# Patient Record
Sex: Female | Born: 1960 | Race: White | Hispanic: No | Smoking: Current some day smoker
Health system: Southern US, Community
[De-identification: ages and names within clinical notes are randomized; demographics above are authoritative.]

## PROBLEM LIST (undated history)

## (undated) DIAGNOSIS — M542 Cervicalgia: Secondary | ICD-10-CM

## (undated) DIAGNOSIS — N63 Unspecified lump in unspecified breast: Secondary | ICD-10-CM

## (undated) DIAGNOSIS — K259 Gastric ulcer, unspecified as acute or chronic, without hemorrhage or perforation: Secondary | ICD-10-CM

## (undated) DIAGNOSIS — M549 Dorsalgia, unspecified: Secondary | ICD-10-CM

## (undated) DIAGNOSIS — G8929 Other chronic pain: Secondary | ICD-10-CM

## (undated) DIAGNOSIS — K219 Gastro-esophageal reflux disease without esophagitis: Secondary | ICD-10-CM

## (undated) DIAGNOSIS — E079 Disorder of thyroid, unspecified: Secondary | ICD-10-CM

## (undated) DIAGNOSIS — K254 Chronic or unspecified gastric ulcer with hemorrhage: Secondary | ICD-10-CM

## (undated) DIAGNOSIS — I10 Essential (primary) hypertension: Secondary | ICD-10-CM

## (undated) DIAGNOSIS — B029 Zoster without complications: Secondary | ICD-10-CM

## (undated) DIAGNOSIS — T7840XA Allergy, unspecified, initial encounter: Secondary | ICD-10-CM

## (undated) DIAGNOSIS — E785 Hyperlipidemia, unspecified: Secondary | ICD-10-CM

## (undated) DIAGNOSIS — M4310 Spondylolisthesis, site unspecified: Secondary | ICD-10-CM

## (undated) HISTORY — DX: Allergy, unspecified, initial encounter: T78.40XA

## (undated) HISTORY — DX: Hyperlipidemia, unspecified: E78.5

## (undated) HISTORY — DX: Gastro-esophageal reflux disease without esophagitis: K21.9

## (undated) HISTORY — DX: Chronic or unspecified gastric ulcer with hemorrhage: K25.4

## (undated) HISTORY — DX: Gastric ulcer, unspecified as acute or chronic, without hemorrhage or perforation: K25.9

---

## 1990-05-25 HISTORY — PX: CHOLECYSTECTOMY: SHX55

## 1998-06-05 ENCOUNTER — Other Ambulatory Visit: Admission: RE | Admit: 1998-06-05 | Discharge: 1998-06-05 | Payer: Self-pay | Admitting: Gynecology

## 1999-06-30 ENCOUNTER — Other Ambulatory Visit: Admission: RE | Admit: 1999-06-30 | Discharge: 1999-06-30 | Payer: Self-pay | Admitting: Gynecology

## 2000-08-04 ENCOUNTER — Other Ambulatory Visit: Admission: RE | Admit: 2000-08-04 | Discharge: 2000-08-04 | Payer: Self-pay | Admitting: Gynecology

## 2001-09-05 ENCOUNTER — Other Ambulatory Visit: Admission: RE | Admit: 2001-09-05 | Discharge: 2001-09-05 | Payer: Self-pay | Admitting: Gynecology

## 2002-01-03 ENCOUNTER — Encounter: Admission: RE | Admit: 2002-01-03 | Discharge: 2002-01-03 | Payer: Self-pay | Admitting: Gynecology

## 2002-01-03 ENCOUNTER — Encounter: Payer: Self-pay | Admitting: Gynecology

## 2002-10-02 ENCOUNTER — Other Ambulatory Visit: Admission: RE | Admit: 2002-10-02 | Discharge: 2002-10-02 | Payer: Self-pay | Admitting: Gynecology

## 2003-01-15 ENCOUNTER — Encounter: Payer: Self-pay | Admitting: Gynecology

## 2003-01-15 ENCOUNTER — Encounter: Admission: RE | Admit: 2003-01-15 | Discharge: 2003-01-15 | Payer: Self-pay | Admitting: Gynecology

## 2003-05-26 HISTORY — PX: ABDOMINAL HYSTERECTOMY: SHX81

## 2003-10-07 ENCOUNTER — Emergency Department (HOSPITAL_COMMUNITY): Admission: EM | Admit: 2003-10-07 | Discharge: 2003-10-07 | Payer: Self-pay | Admitting: Emergency Medicine

## 2003-12-03 ENCOUNTER — Other Ambulatory Visit: Admission: RE | Admit: 2003-12-03 | Discharge: 2003-12-03 | Payer: Self-pay | Admitting: Gynecology

## 2004-01-03 ENCOUNTER — Encounter (INDEPENDENT_AMBULATORY_CARE_PROVIDER_SITE_OTHER): Payer: Self-pay | Admitting: *Deleted

## 2004-01-03 ENCOUNTER — Observation Stay (HOSPITAL_COMMUNITY): Admission: RE | Admit: 2004-01-03 | Discharge: 2004-01-04 | Payer: Self-pay | Admitting: Gynecology

## 2004-04-07 ENCOUNTER — Ambulatory Visit (HOSPITAL_COMMUNITY): Admission: RE | Admit: 2004-04-07 | Discharge: 2004-04-07 | Payer: Self-pay | Admitting: Gynecology

## 2005-05-12 ENCOUNTER — Ambulatory Visit (HOSPITAL_COMMUNITY): Admission: RE | Admit: 2005-05-12 | Discharge: 2005-05-12 | Payer: Self-pay | Admitting: Gynecology

## 2006-05-13 ENCOUNTER — Ambulatory Visit (HOSPITAL_COMMUNITY): Admission: RE | Admit: 2006-05-13 | Discharge: 2006-05-13 | Payer: Self-pay | Admitting: Gynecology

## 2007-05-25 ENCOUNTER — Ambulatory Visit (HOSPITAL_COMMUNITY): Admission: RE | Admit: 2007-05-25 | Discharge: 2007-05-25 | Payer: Self-pay | Admitting: Gynecology

## 2008-06-06 ENCOUNTER — Ambulatory Visit (HOSPITAL_COMMUNITY): Admission: RE | Admit: 2008-06-06 | Discharge: 2008-06-06 | Payer: Self-pay | Admitting: Gynecology

## 2009-03-07 ENCOUNTER — Encounter: Admission: RE | Admit: 2009-03-07 | Discharge: 2009-03-07 | Payer: Self-pay | Admitting: Internal Medicine

## 2009-05-30 ENCOUNTER — Ambulatory Visit (HOSPITAL_COMMUNITY): Admission: RE | Admit: 2009-05-30 | Discharge: 2009-05-30 | Payer: Self-pay | Admitting: Gynecology

## 2010-06-15 ENCOUNTER — Encounter: Payer: Self-pay | Admitting: Gynecology

## 2010-10-10 NOTE — Op Note (Signed)
NAME:  Kristin Mann, Kristin Mann                     ACCOUNT NO.:  [id number]   MEDICAL RECORD NO.:  [id number]                   PATIENT TYPE:  OBV   LOCATION:  0464                                 FACILITY:  [facility]   PHYSICIAN:  Gretta Cool, M.D.              DATE OF BIRTH:  07-14-1960   DATE OF PROCEDURE:  DATE OF DISCHARGE:  01/04/2004                                 OPERATIVE REPORT   PREOPERATIVE DIAGNOSES:  1. Abnormal uterine bleeding with endometrial polyp versus fibroid.  2. History of cesarean section x3.   POSTOPERATIVE DIAGNOSIS:  Endometrial polyp with abnormal uterine bleeding.   PROCEDURE:  Supracervical hysterectomy.   SURGEON:  Gretta Cool, M.D.   ASSISTANT:  Raynald Kemp, M.D.   ANESTHESIA:  General orotracheal.   DESCRIPTION OF PROCEDURE:  Under excellent general anesthesia with the  patient prepped and draped in supine position with Foley catheter draining  the bladder a Pfannenstiel incision was made and extended through the  fascia, peritoneum was then opened and the abdomen explored, there were no  abnormalities identified in the upper abdomen.  Examination into the pelvis  revealed normal ovaries, a broad soft uterus with no evidence of  endometriosis or other pelvic pathology.  The round ligaments were then  sutured and tied and transected, the anterior length of the broad ligament  was opened and the bladder flap pushed off the lower segment.  The ovarian  ligaments were then isolated, clamped, cut, sutured, and tied with 0 Vicryl.  The uterine vessel was then clamped, cut, sutured, and tied with 0 Vicryl.  The cervix was then excised and the center of the cervix treated with  cautery so as to eliminate any viable endocervical/endometrial tissue in the  canal of the cervix.  At this point the cervix was closed with a running  mattress suture of 0 Vicryl.  At this point the pelvic floor was  reperitonealized with a running suture of 2-0 Monocryl.   The packs and  retractors were then removed, pelvis irrigated, the abdominal peritoneum was  then closed with a running suture of 0 Monocryl.  The fascia was then  approximated with a running suture of 0 Vicryl from each angle of the  midline, subcutaneous tissues approximated with interrupted sutures with 3-0  Vicryl, the skin was closed with skin staples and Steri-Strips.  At the end  of the procedure sponge and lap counts were correct, there were no  complications and patient returned to recovery room in excellent condition.                                               Gretta Cool, M.D.    CWL/MEDQ  D:  01/04/2004  T:  01/04/2004  Job:  [id number]   cc:   [facility]

## 2010-10-10 NOTE — H&P (Signed)
NAME:  Kristin Mann, Kristin Mann                         ACCOUNT NO.:  [id number]   MEDICAL RECORD NO.:  [id number]                   PATIENT TYPE:  AMB   LOCATION:  DAY                                  FACILITY:  [facility]   PHYSICIAN:  Gretta Cool, M.D.              DATE OF BIRTH:  1960-10-07   DATE OF ADMISSION:  01/03/2004  DATE OF DISCHARGE:                                HISTORY & PHYSICAL   CHIEF COMPLAINT:  Abnormal uterine bleeding.   HISTORY OF PRESENT ILLNESS:  A 50 year old gravida 4, para 3, AB 1, with a  history of cesarean section deliveries and tubal sterilization procedure.  She has an uneventful obstetric history other than cesarean section.  She  has over the last year developed increasing abnormal uterine bleeding, now  very heavy flow with clots and severe cramps.  She is confined to the home a  day or two of her cycle because of the extremely heavy bleeding.  Ultrasound  examination reveals a thickened endometrium measuring 1.4 cm.  She has a  very heterogenous character of endometrial lining highly suggestive of  endometrial polyp.  She also has known uterine leiomyomata encroaching the  anterior uterine wall.  She has been offered options, including hysteroscopy  and conservative management.  She at this point wishes to proceed to  definitive therapy by supracervical versus total abdominal hysterectomy,  possible salpingo-oophorectomy.   PAST MEDICAL HISTORY:  The usual childhood diseases without sequelae.   Medical illnesses:  Recent onset of hypertension sufficient to require  therapy, now on Benicar.  She has long-term hypothyroid, on replacement,  autoimmune hypothyroid.  She is also on Prilosec for GERD.   FAMILY HISTORY:  Mother recently died of diabetes at early age.  She also  has a history of hypothyroidism, stroke.  Father also had stroke.  The  patient also has severe dyslipidemia, particularly with marked elevation of  triglycerides, with a hemoglobin  A1C elevated, suspicious of at least  impaired glucose tolerance.  No other known familial tendencies.   HABITS:  Denies tobacco but consumes as much as one bottle of wine daily.  Denies recreational drugs.   SOCIAL HISTORY:  Divorced.  Mother of three with three children living at  home.   REVIEW OF SYSTEMS:  HEENT:  Denies symptoms.  CARDIAC:  Denies asthma,  cough, bronchitis, shortness of breath.  GASTROINTESTINAL:  Denies frequency  or urgency or change in bowel habits __________.   PHYSICAL EXAMINATION:  GENERAL:  A fairly well-developed, well-nourished  white female, rather massively over ideal weight with BMI greater than 40,  with massive abdomen to hip ratio.  HEENT:  Pupils equal and react to light and accommodation.  Fundi not  examined.  Oropharynx clear.  NECK:  Supple without mass or thyroid enlargement.  No node enlargement.  BREASTS:  Without mass, nodes, nipple discharge.  CHEST:  Clear P to A.  CARDIAC:  Regular rhythm without murmur or cardiac enlargement.  ABDOMEN:  Massive panniculus without mass or organomegaly.  PELVIC:  External genitalia normal female.  Vagina clean and rugous.  Cervix  is nulliparous in appearance, clean, and uterus normal size, shape, contour.  Adnexa clear.  Rectovaginal confirms.  Note pelvic is limited by abdominal  girth.   IMPRESSION:  1. Abnormal uterine bleeding with submucous fibroids bulging against the     cavity and endometrial polyp.  2. Autoimmune hypothyroid.  3. Hypertension.  4. Dyslipidemia with hypertriglyceridemia.  5. Impaired glucose tolerance.   PLAN:  Supracervical versus total abdominal hysterectomy, possible salpingo-  oophorectomy.                                               Gretta Cool, M.D.    CWL/MEDQ  D:  01/02/2004  T:  01/03/2004  Job:  [id number]   cc:   [facility]

## 2011-08-11 ENCOUNTER — Other Ambulatory Visit (HOSPITAL_COMMUNITY): Payer: Self-pay | Admitting: Gynecology

## 2011-08-11 DIAGNOSIS — Z1231 Encounter for screening mammogram for malignant neoplasm of breast: Secondary | ICD-10-CM

## 2011-08-24 ENCOUNTER — Ambulatory Visit (HOSPITAL_COMMUNITY)
Admission: RE | Admit: 2011-08-24 | Discharge: 2011-08-24 | Disposition: A | Discharge status: Home / Self Care | Payer: Self-pay | Source: Ambulatory Visit | Attending: Gynecology | Admitting: Gynecology

## 2011-08-24 DIAGNOSIS — Z1231 Encounter for screening mammogram for malignant neoplasm of breast: Secondary | ICD-10-CM

## 2011-08-26 ENCOUNTER — Telehealth: Payer: Self-pay

## 2011-08-26 NOTE — Telephone Encounter (Signed)
.  [facility] The patient called requesting cd of xrays on spine/shoulder from 01/23/2010 ([facility]). Patient would like to be called at [phone] when ready for pick-up.

## 2011-08-27 NOTE — Telephone Encounter (Signed)
Note was left for Xray req copy to be made.

## 2011-08-28 NOTE — Telephone Encounter (Signed)
Notified pt that xray copy is ready for p/up. Pt was very appreciative.

## 2011-09-14 ENCOUNTER — Encounter (HOSPITAL_BASED_OUTPATIENT_CLINIC_OR_DEPARTMENT_OTHER): Payer: Self-pay

## 2011-09-14 ENCOUNTER — Emergency Department (HOSPITAL_BASED_OUTPATIENT_CLINIC_OR_DEPARTMENT_OTHER)
Admission: EM | Admit: 2011-09-14 | Discharge: 2011-09-14 | Disposition: A | Discharge status: Home / Self Care | Payer: Self-pay | Attending: Emergency Medicine | Admitting: Emergency Medicine

## 2011-09-14 DIAGNOSIS — Z760 Encounter for issue of repeat prescription: Secondary | ICD-10-CM

## 2011-09-14 DIAGNOSIS — R209 Unspecified disturbances of skin sensation: Secondary | ICD-10-CM | POA: Insufficient documentation

## 2011-09-14 DIAGNOSIS — F411 Generalized anxiety disorder: Secondary | ICD-10-CM | POA: Insufficient documentation

## 2011-09-14 DIAGNOSIS — G5600 Carpal tunnel syndrome, unspecified upper limb: Secondary | ICD-10-CM

## 2011-09-14 DIAGNOSIS — M542 Cervicalgia: Secondary | ICD-10-CM

## 2011-09-14 DIAGNOSIS — I10 Essential (primary) hypertension: Secondary | ICD-10-CM | POA: Insufficient documentation

## 2011-09-14 DIAGNOSIS — E119 Type 2 diabetes mellitus without complications: Secondary | ICD-10-CM | POA: Insufficient documentation

## 2011-09-14 DIAGNOSIS — F419 Anxiety disorder, unspecified: Secondary | ICD-10-CM

## 2011-09-14 HISTORY — DX: Essential (primary) hypertension: I10

## 2011-09-14 HISTORY — DX: Disorder of thyroid, unspecified: E07.9

## 2011-09-14 HISTORY — DX: Zoster without complications: B02.9

## 2011-09-14 HISTORY — DX: Spondylolisthesis, site unspecified: M43.10

## 2011-09-14 LAB — URINALYSIS, ROUTINE W REFLEX MICROSCOPIC
Glucose, UA: NEGATIVE mg/dL
Hgb urine dipstick: NEGATIVE
Ketones, ur: NEGATIVE mg/dL
Leukocytes, UA: NEGATIVE
Nitrite: NEGATIVE
Protein, ur: NEGATIVE mg/dL
Urobilinogen, UA: 0.2 mg/dL (ref 0.0–1.0)

## 2011-09-14 LAB — GLUCOSE, CAPILLARY: Glucose-Capillary: 110 mg/dL — ABNORMAL HIGH (ref 70–99)

## 2011-09-14 MED ORDER — LEVOTHYROXINE SODIUM 150 MCG PO TABS: 150.0000 ug | ORAL_TABLET | Freq: Every day | ORAL | Status: DC

## 2011-09-14 MED ORDER — DIAZEPAM 5 MG PO TABS: 5.0000 mg | ORAL_TABLET | Freq: Three times a day (TID) | ORAL | Status: DC | PRN

## 2011-09-14 MED ORDER — DIAZEPAM 5 MG PO TABS
5.0000 mg | ORAL_TABLET | Freq: Once | ORAL | Status: AC
  Administered 2011-09-14: 5 mg via ORAL
  Filled 2011-09-14: qty 1

## 2011-09-14 NOTE — Discharge Instructions (Signed)
Anxiety and Panic Attacks Your caregiver has informed you that you are having an anxiety or panic attack. There may be many forms of this. Most of the time these attacks come suddenly and without warning. They come at any time of day, including periods of sleep, and at any time of life. They may be strong and unexplained. Although panic attacks are very scary, they are physically harmless. Sometimes the cause of your anxiety is not known. Anxiety is a protective mechanism of the body in its fight or flight mechanism. Most of these perceived danger situations are actually nonphysical situations (such as anxiety over losing a job). CAUSES  The causes of an anxiety or panic attack are many. Panic attacks may occur in otherwise healthy people given a certain set of circumstances. There may be a genetic cause for panic attacks. Some medications may also have anxiety as a side effect. SYMPTOMS  Some of the most common feelings are:  Intense terror.   Dizziness, feeling faint.   Hot and cold flashes.   Fear of going crazy.   Feelings that nothing is real.   Sweating.   Shaking.   Chest pain or a fast heartbeat (palpitations).   Smothering, choking sensations.   Feelings of impending doom and that death is near.   Tingling of extremities, this may be from over-breathing.   Altered reality (derealization).   Being detached from yourself (depersonalization).  Several symptoms can be present to make up anxiety or panic attacks. DIAGNOSIS  The evaluation by your caregiver will depend on the type of symptoms you are experiencing. The diagnosis of anxiety or panic attack is made when no physical illness can be determined to be a cause of the symptoms. TREATMENT  Treatment to prevent anxiety and panic attacks may include:  Avoidance of circumstances that cause anxiety.   Reassurance and relaxation.   Regular exercise.   Relaxation therapies, such as yoga.   Psychotherapy with a  psychiatrist or therapist.   Avoidance of caffeine, alcohol and illegal drugs.   Prescribed medication.  SEEK IMMEDIATE MEDICAL CARE IF:   You experience panic attack symptoms that are different than your usual symptoms.   You have any worsening or concerning symptoms.  Document Released: 05/11/2005 Document Revised: 04/30/2011 Document Reviewed: 09/12/2009 [facility] Patient Information 2012 [place], [place].Carpal Tunnel Syndrome The carpal tunnel is a narrow hollow area in the wrist. It is formed by the wrist bones and ligaments. Nerves, blood vessels, and tendons (cord like structures which attach muscle to bone) on the palm side (the side of your hand in the direction your fingers bend) of your hand pass through the carpal tunnel. Repeated wrist motion or certain diseases may cause swelling within the tunnel. (That is why these are called repetitive trauma (damage caused by over use) disorders. It is also a common problem in late pregnancy.) This swelling pinches the main nerve in the wrist (median nerve) and causes the painful condition called carpal tunnel syndrome. A feeling of "pins and needles" may be noticed in the fingers or hand; however, the entire arm may ache from this condition. Carpal tunnel syndrome may clear up by itself. Cortisone injections may help. Sometimes, an operation may be needed to free the pinched nerve. An electromyogram (a type of test) may be needed to confirm this diagnosis (learning what is wrong). This is a test which measures nerve conduction. The nerve conduction is usually slowed in a carpal tunnel syndrome. HOME CARE INSTRUCTIONS   If your caregiver  prescribed medication to help reduce swelling, take as directed.   If you were given a splint to keep your wrist from bending, use it as instructed. It is important to wear the splint at night. Use the splint for as long as you have pain or numbness in your hand, arm or wrist. This may take 1 to 2 months.   If you  have pain at night, it may help to rub or shake your hand, or elevate your hand above the level of your heart (the center of your chest).   It is important to give your wrist a rest by stopping the activities that are causing the problem. If your symptoms (problems) are work-related, you may need to talk to your employer about changing to a job that does not require using your wrist.   Only take over-the-counter or prescription medicines for pain, discomfort, or fever as directed by your caregiver.   Following periods of extended use, particularly strenuous use, apply an ice pack wrapped in a towel to the anterior (palm) side of the affected wrist for 20 to 30 minutes. Repeat as needed three to four times per day. This will help reduce the swelling.   Follow all instructions for follow-up with your caregiver. This includes any orthopedic referrals, physical therapy, and rehabilitation. Any delay in obtaining necessary care could result in a delay or failure of your condition to heal.  SEEK IMMEDIATE MEDICAL CARE IF:   You are still having pain and numbness following a week of treatment.   You develop new, unexplained symptoms.   Your current symptoms are getting worse and are not helped or controlled with medications.  MAKE SURE YOU:   Understand these instructions.   Will watch your condition.   Will get help right away if you are not doing well or get worse.  Document Released: 05/08/2000 Document Revised: 04/30/2011 Document Reviewed: 03/27/2011 [facility] Patient Information 2012 [place], [place].Medication Refill, Emergency Department We have refilled your medication today as a courtesy to you. It is best for your medical care, however, to take care of getting refills done through your primary caregiver's office. They have your records and can do a better job of follow-up than we can in the emergency department. On maintenance medications, we often only prescribe enough medications to get  you by until you are able to see your regular caregiver. This is a more expensive way to refill medications. In the future, please plan for refills so that you will not have to use the emergency department for this. Thank you for your help. Your help allows [place] to better take care of the daily emergencies that enter our department. Document Released: 08/28/2003 Document Revised: 04/30/2011 Document Reviewed: 05/11/2005 [facility] Patient Information 2012 [place], [place].

## 2011-09-14 NOTE — ED Notes (Signed)
Pt is going through finanical difficulties, states that she has not been monitoring her BS appropriately, states that she is having nausea, vomiting, visual disturbances, numbness and tingling in her hands, and she is highly anxious, c/o back pain, and is crying at triage.
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
## 2011-11-16 DIAGNOSIS — I10 Essential (primary) hypertension: Secondary | ICD-10-CM | POA: Insufficient documentation

## 2011-11-16 DIAGNOSIS — G8929 Other chronic pain: Secondary | ICD-10-CM | POA: Insufficient documentation

## 2011-11-16 DIAGNOSIS — E119 Type 2 diabetes mellitus without complications: Secondary | ICD-10-CM | POA: Insufficient documentation

## 2011-11-16 DIAGNOSIS — M549 Dorsalgia, unspecified: Secondary | ICD-10-CM | POA: Insufficient documentation

## 2011-11-17 ENCOUNTER — Encounter (HOSPITAL_COMMUNITY): Payer: Self-pay | Admitting: Emergency Medicine

## 2011-11-17 ENCOUNTER — Emergency Department (HOSPITAL_COMMUNITY): Payer: Self-pay

## 2011-11-17 ENCOUNTER — Emergency Department (HOSPITAL_COMMUNITY)
Admission: EM | Admit: 2011-11-17 | Discharge: 2011-11-17 | Disposition: A | Discharge status: Home / Self Care | Payer: Self-pay | Attending: Emergency Medicine | Admitting: Emergency Medicine

## 2011-11-17 DIAGNOSIS — G8929 Other chronic pain: Secondary | ICD-10-CM

## 2011-11-17 LAB — GLUCOSE, CAPILLARY: Glucose-Capillary: 142 mg/dL — ABNORMAL HIGH (ref 70–99)

## 2011-11-17 MED ORDER — HYDROMORPHONE HCL PF 2 MG/ML IJ SOLN
2.0000 mg | Freq: Once | INTRAMUSCULAR | Status: AC
  Administered 2011-11-17: 2 mg via INTRAMUSCULAR
  Filled 2011-11-17: qty 1

## 2011-11-17 MED ORDER — KETOROLAC TROMETHAMINE 60 MG/2ML IM SOLN
60.0000 mg | Freq: Once | INTRAMUSCULAR | Status: AC
  Administered 2011-11-17: 60 mg via INTRAMUSCULAR
  Filled 2011-11-17: qty 2

## 2011-11-17 MED ORDER — OXYCODONE-ACETAMINOPHEN 5-325 MG PO TABS: 1.0000 | ORAL_TABLET | ORAL | Status: AC | PRN

## 2011-11-17 MED ORDER — DIAZEPAM 5 MG PO TABS
5.0000 mg | ORAL_TABLET | Freq: Once | ORAL | Status: AC
  Administered 2011-11-17: 5 mg via ORAL
  Filled 2011-11-17: qty 1
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
## 2011-11-17 NOTE — ED Notes (Signed)
Pt states today she was incontinent of stool and that has never happened before

## 2011-11-17 NOTE — Discharge Instructions (Signed)
Back Exercises Back exercises help treat and prevent back injuries. The goal of back exercises is to increase the strength of your abdominal and back muscles and the flexibility of your back. These exercises should be started when you no longer have back pain. Back exercises include:  Pelvic Tilt. Lie on your back with your knees bent. Tilt your pelvis until the lower part of your back is against the floor. Hold this position 5 to 10 sec and repeat 5 to 10 times.   Knee to Chest. Pull first 1 knee up against your chest and hold for 20 to 30 seconds, repeat this with the other knee, and then both knees. This may be done with the other leg straight or bent, whichever feels better.   Sit-Ups or Curl-Ups. Bend your knees 90 degrees. Start with tilting your pelvis, and do a partial, slow sit-up, lifting your trunk only 30 to 45 degrees off the floor. Take at least 2 to 3 seconds for each sit-up. Do not do sit-ups with your knees out straight. If partial sit-ups are difficult, simply do the above but with only tightening your abdominal muscles and holding it as directed.   Hip-Lift. Lie on your back with your knees flexed 90 degrees. Push down with your feet and shoulders as you raise your hips a couple inches off the floor; hold for 10 seconds, repeat 5 to 10 times.   Back arches. Lie on your stomach, propping yourself up on bent elbows. Slowly press on your hands, causing an arch in your low back. Repeat 3 to 5 times. Any initial stiffness and discomfort should lessen with repetition over time.   Shoulder-Lifts. Lie face down with arms beside your body. Keep hips and torso pressed to floor as you slowly lift your head and shoulders off the floor.  Do not overdo your exercises, especially in the beginning. Exercises may cause you some mild back discomfort which lasts for a few minutes; however, if the pain is more severe, or lasts for more than 15 minutes, do not continue exercises until you see your  caregiver. Improvement with exercise therapy for back problems is slow.  See your caregivers for assistance with developing a proper back exercise program. Document Released: 06/18/2004 Document Revised: 04/30/2011 Document Reviewed: 05/11/2005 [employer] Patient Information 2012 [employer]. 
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
## 2011-12-04 ENCOUNTER — Encounter (HOSPITAL_BASED_OUTPATIENT_CLINIC_OR_DEPARTMENT_OTHER): Payer: Self-pay

## 2011-12-04 ENCOUNTER — Emergency Department (HOSPITAL_BASED_OUTPATIENT_CLINIC_OR_DEPARTMENT_OTHER)
Admission: EM | Admit: 2011-12-04 | Discharge: 2011-12-04 | Disposition: A | Discharge status: Home / Self Care | Payer: Self-pay | Attending: Emergency Medicine | Admitting: Emergency Medicine

## 2011-12-04 ENCOUNTER — Emergency Department (HOSPITAL_BASED_OUTPATIENT_CLINIC_OR_DEPARTMENT_OTHER): Payer: Self-pay

## 2011-12-04 DIAGNOSIS — M542 Cervicalgia: Secondary | ICD-10-CM | POA: Insufficient documentation

## 2011-12-04 DIAGNOSIS — F17200 Nicotine dependence, unspecified, uncomplicated: Secondary | ICD-10-CM | POA: Insufficient documentation

## 2011-12-04 DIAGNOSIS — E079 Disorder of thyroid, unspecified: Secondary | ICD-10-CM | POA: Insufficient documentation

## 2011-12-04 DIAGNOSIS — G8929 Other chronic pain: Secondary | ICD-10-CM | POA: Insufficient documentation

## 2011-12-04 DIAGNOSIS — E119 Type 2 diabetes mellitus without complications: Secondary | ICD-10-CM | POA: Insufficient documentation

## 2011-12-04 DIAGNOSIS — I10 Essential (primary) hypertension: Secondary | ICD-10-CM | POA: Insufficient documentation

## 2011-12-04 HISTORY — DX: Other chronic pain: G89.29

## 2011-12-04 HISTORY — DX: Dorsalgia, unspecified: M54.9

## 2011-12-04 HISTORY — DX: Cervicalgia: M54.2

## 2011-12-04 MED ORDER — OXYCODONE-ACETAMINOPHEN 5-325 MG PO TABS
1.0000 | ORAL_TABLET | Freq: Once | ORAL | Status: AC
  Administered 2011-12-04: 1 via ORAL
  Filled 2011-12-04: qty 1

## 2011-12-04 MED ORDER — LEVOTHYROXINE SODIUM 150 MCG PO TABS: 50.0000 ug | ORAL_TABLET | Freq: Every day | ORAL | Status: DC

## 2011-12-04 MED ORDER — HYDROCHLOROTHIAZIDE 25 MG PO TABS: 12.5000 mg | ORAL_TABLET | Freq: Every day | ORAL | Status: DC

## 2011-12-04 MED ORDER — BENAZEPRIL HCL 10 MG PO TABS: 40.0000 mg | ORAL_TABLET | Freq: Every day | ORAL | Status: DC

## 2011-12-04 MED ORDER — OXYCODONE-ACETAMINOPHEN 5-325 MG PO TABS: 1.0000 | ORAL_TABLET | ORAL | Status: AC | PRN

## 2011-12-04 MED ORDER — METFORMIN HCL 500 MG PO TABS: 500.0000 mg | ORAL_TABLET | Freq: Two times a day (BID) | ORAL | Status: DC

## 2011-12-04 NOTE — ED Provider Notes (Signed)
History     CSN: [id number]  Arrival date & time 12/04/11  1437   First MD Initiated Contact with Patient 12/04/11 1635      Chief Complaint  Patient presents with  . Neck Pain    (Consider location/radiation/quality/duration/timing/severity/associated sxs/prior treatment) Patient is a 51 y.o. female presenting with neck pain. The history is provided by the patient.  Neck Pain  This is a chronic problem. The current episode started 2 days ago. The problem occurs constantly. The problem has not changed since onset.Pertinent negatives include no numbness. Associated symptoms comments: She reports chronic neck pain that is worse after doing house cleaning. No fall or direct trauma. She reports being out of her pain medication. .    Past Medical History  Diagnosis Date  . Diabetes mellitus   . Thyroid disease   . Hypertension   . Shingles   . Spondylolisthesis   . Chronic neck pain   . Chronic back pain     Past Surgical History  Procedure Date  . Cholecystectomy   . Abdominal hysterectomy   . Cesarean section     x3    Family History  Problem Relation Age of Onset  . Diabetes Mother   . Cancer Father     History  Substance Use Topics  . Smoking status: Current Some Day Smoker    Types: Cigarettes  . Smokeless tobacco: Never Used  . Alcohol Use: No    OB History    Grav Para Term Preterm Abortions TAB SAB Ect Mult Living                  Review of Systems  Constitutional: Negative for fever.  HENT: Positive for neck pain.   Neurological: Negative for numbness.    Allergies  Penicillins and Vicodin  Home Medications   Current Outpatient Rx  Name Route Sig Dispense Refill  . ACETAMINOPHEN ER 650 MG PO TBCR Oral Take 650 mg by mouth every 8 (eight) hours as needed. Patient uses this medication for her arthritis.    . BC HEADACHE POWDER PO Oral Take 1 packet by mouth daily as needed. Patient used this medication for her pain.    Marland Kitchen ACID REDUCER PO Oral  Take 1 tablet by mouth daily. Patient used this medication for acid reflux.    Marland Kitchen HYDROCHLOROTHIAZIDE 12.5 MG PO TABS Oral Take 12.5 mg by mouth daily.    Marland Kitchen METFORMIN HCL ER (MOD) 500 MG PO TB24 Oral Take 500 mg by mouth 2 (two) times daily with a meal.     . OXYCODONE-ACETAMINOPHEN 5-325 MG PO TABS Oral Take 1 tablet by mouth every 4 (four) hours as needed.    Marland Kitchen BENAZEPRIL HCL 40 MG PO TABS Oral Take 40 mg by mouth daily.    Marland Kitchen LEVOTHYROXINE SODIUM 150 MCG PO TABS Oral Take 1 tablet (150 mcg total) by mouth daily. 30 tablet 1    BP 187/98  Pulse 99  Temp 98.3 F (36.8 C) (Oral)  Resp 18  SpO2 99%  Physical Exam  Constitutional: She is oriented to person, place, and time. She appears well-developed and well-nourished. No distress.  Musculoskeletal:       Mild bilateral paracervical spinal tenderness. No swelling. FROM neck and bilateral upper extremities. Normal and equal strength.   Neurological: She is alert and oriented to person, place, and time.       No sensation deficits to light touch. Grip strength is 5/5 bilateral upper extremities.  Skin: Skin is warm and dry.  Psychiatric: She has a normal mood and affect.    ED Course  Procedures (including critical care time)  Labs Reviewed  GLUCOSE, CAPILLARY - Abnormal; Notable for the following:    Glucose-Capillary 140 (*)     All other components within normal limits   Dg Cervical Spine Complete  12/04/2011  *RADIOLOGY REPORT*  Clinical Data: Neck pain.  CERVICAL SPINE - COMPLETE 4+ VIEW  Comparison: None.  Findings: The cervical spine is visualized from the occiput to the cervicothoracic junction.  There is reversal of the normal cervical lordosis without subluxation or fracture.  Mild multilevel uncovertebral hypertrophy and anterior marginal osteophytosis. Loss of disc space height at C5-6.  Multilevel facet hypertrophy. Prevertebral soft tissues are within normal limits.  Neural foramina appear grossly patent.  Difficult to  exclude left neural foraminal narrowing in the lower cervical spine on the left.  Dens is obscured on the dedicated view.  IMPRESSION:  1.  No acute findings. 2.  Reversal of the normal cervical lordosis with spondylosis, worst at C5-6.  Original Report Authenticated By: Reyes Ivan, M.D.     No diagnosis found. 1. Neck pain, acute on chronic 2. Hypertension    MDM  Cervical films negative. Medication refilled for percocet. She requested regular medications be refilled as well as she is trying to get established with a primary care provider.         [name], PA-C 12/04/11 1837

## 2011-12-04 NOTE — ED Notes (Addendum)
Hx of chronic back and neck pain-c/o increased pain to neck with tingling down both arms-denies injury but has had increased work load of cleaning-pt also concerned she is out of daily meds and unable to afford MD visit

## 2011-12-04 NOTE — ED Notes (Signed)
Pt acuity changed from 5 to 4 based on plan of care.

## 2011-12-05 NOTE — ED Provider Notes (Signed)
Medical screening examination/treatment/procedure(s) were performed by non-physician practitioner and as supervising physician I was immediately available for consultation/collaboration.   Forbes Cellar, MD 12/05/11 [phone]

## 2011-12-28 ENCOUNTER — Ambulatory Visit (INDEPENDENT_AMBULATORY_CARE_PROVIDER_SITE_OTHER): Payer: Self-pay | Admitting: Family Medicine

## 2011-12-28 ENCOUNTER — Encounter: Payer: Self-pay | Admitting: Family Medicine

## 2011-12-28 VITALS — BP 154/92 | HR 87 | Temp 98.9°F | Ht 62.0 in | Wt 190.0 lb

## 2011-12-28 DIAGNOSIS — E1169 Type 2 diabetes mellitus with other specified complication: Secondary | ICD-10-CM

## 2011-12-28 DIAGNOSIS — M549 Dorsalgia, unspecified: Secondary | ICD-10-CM

## 2011-12-28 DIAGNOSIS — E1140 Type 2 diabetes mellitus with diabetic neuropathy, unspecified: Secondary | ICD-10-CM | POA: Insufficient documentation

## 2011-12-28 DIAGNOSIS — Z720 Tobacco use: Secondary | ICD-10-CM

## 2011-12-28 DIAGNOSIS — E119 Type 2 diabetes mellitus without complications: Secondary | ICD-10-CM

## 2011-12-28 DIAGNOSIS — E669 Obesity, unspecified: Secondary | ICD-10-CM

## 2011-12-28 DIAGNOSIS — I10 Essential (primary) hypertension: Secondary | ICD-10-CM

## 2011-12-28 DIAGNOSIS — G609 Hereditary and idiopathic neuropathy, unspecified: Secondary | ICD-10-CM

## 2011-12-28 DIAGNOSIS — G8929 Other chronic pain: Secondary | ICD-10-CM

## 2011-12-28 DIAGNOSIS — F17200 Nicotine dependence, unspecified, uncomplicated: Secondary | ICD-10-CM

## 2011-12-28 DIAGNOSIS — G629 Polyneuropathy, unspecified: Secondary | ICD-10-CM

## 2011-12-28 DIAGNOSIS — E039 Hypothyroidism, unspecified: Secondary | ICD-10-CM

## 2011-12-28 MED ORDER — LEVOTHYROXINE SODIUM 150 MCG PO TABS: 150.0000 ug | ORAL_TABLET | Freq: Every day | ORAL | Status: DC

## 2011-12-28 MED ORDER — METFORMIN HCL 500 MG PO TABS: 500.0000 mg | ORAL_TABLET | Freq: Two times a day (BID) | ORAL | Status: DC

## 2011-12-28 MED ORDER — TRAMADOL HCL 50 MG PO TABS: 50.0000 mg | ORAL_TABLET | Freq: Three times a day (TID) | ORAL | Status: AC | PRN

## 2011-12-28 MED ORDER — BENAZEPRIL HCL 40 MG PO TABS: 40.0000 mg | ORAL_TABLET | Freq: Every day | ORAL | Status: DC

## 2011-12-28 NOTE — Patient Instructions (Addendum)
NIce to meet you. I will refill your BP medications. Make an appointment to follow up after you speak with [facility]. Need to get orange card for financial help. We can discuss treatment options for chronic pain. Percocet is not your best option. You can try tramadol or tylenol.

## 2011-12-28 NOTE — Assessment & Plan Note (Signed)
Uncertain control. No symptoms of severe hyperglycemia. Continue metformin, followup for labs including A1c.

## 2011-12-28 NOTE — Assessment & Plan Note (Signed)
Above goal 2 to noncompliance. Refill benazapril and continue HCTZ. Will followup within one month for labs.

## 2011-12-28 NOTE — Progress Notes (Signed)
Subjective:    Patient ID: Kristin Mann, female    DOB: 01-09-1961, 51 y.o.   MRN: [id number]  HPI  new patient to establish care.  1. Hypertension. Has been elevated recently. Patient states she has trouble getting medications that she has been without a PCP for many years. Previously seen at [facility] family practice last visit in 2008, states she has a PA at her previous place of employment who gave her prescriptions in the interim. She has been taking benazepril and HCTZ, given one-time prescription in the ED. Denies any chest pain, dyspnea, leg edema, urinary changes.  2. diabetes. Patient cannot afford test strip. She is compliant with her metformin. Date she mainly broth vegetables and exercises when she can. She is limited by her chronic pain. She does have bilateral foot numbness and tingling, worse at nighttime. Patient denies any polyuria, polydipsia, weight changes.  3. chronic back pain. Patient states she was diagnosed with spondylolisthesis of her neck and lumbar spine many years ago. This limits her ability to exercise, previously she was very physically active. Surgery not pursued. She was seen by orthopedist and neurologist Dr. Channing Mutters. States she was prescribed Anaprox which caused gastric ulcers previously. She states she is allergic to Vicodin and other pain medications. Only affective med currently is Percocet which she has had trouble getting without a PCP. Denies any weakness, incontinence, recent trauma.  Past Medical History  Diagnosis Date  . Thyroid disease   . Hypertension   . Shingles   . Spondylolisthesis   . Chronic neck pain   . Chronic back pain   . Allergy   . Diabetes mellitus 2009  . GERD (gastroesophageal reflux disease)   . Gastric ulcer with hemorrhage     anaprox induced   Past Surgical History  Procedure Date  . Cholecystectomy   . Cesarean section     x3  . Abdominal hysterectomy     partial-residual cervix   History   Social History    . Marital Status: Divorced    Spouse Name: N/A    Number of Children: N/A  . Years of Education: N/A   Occupational History  . Not on file.   Social History Main Topics  . Smoking status: Current Some Day Smoker    Types: Cigarettes  . Smokeless tobacco: Never Used  . Alcohol Use: No  . Drug Use: No  . Sexually Active:    Other Topics Concern  . Not on file   Social History Narrative   Lives alone, divorced with grown children. Unemployed High school graduate.   Family History  Problem Relation Age of Onset  . Diabetes Mother   . Hyperlipidemia Mother   . Hypertension Mother   . Cancer Father     prostate  . Hyperlipidemia Father   . Hypertension Father   . Hyperlipidemia Sister   . Hypertension Sister     Review of Systems See HPI otherwise negative.    Objective:   Physical Exam  Vitals reviewed. Constitutional: She is oriented to person, place, and time. She appears well-developed and well-nourished. No distress.  HENT:  Head: Normocephalic and atraumatic.  Mouth/Throat: Oropharynx is clear and moist.  Eyes: EOM are normal. Pupils are equal, round, and reactive to light.  Neck: Normal range of motion. Neck supple.  Cardiovascular: Normal rate, regular rhythm and normal heart sounds.   No murmur heard. Pulmonary/Chest: Effort normal and breath sounds normal. No respiratory distress. She has no wheezes.  Abdominal:  Soft. Bowel sounds are normal. She exhibits no distension. There is no tenderness.  Musculoskeletal: She exhibits no edema and no tenderness.       Normal gait. Patient sits crosslegged on exam table. No impairment in range of motion noted. No sinus tenderness.  Feet with bilateral symmetric decreased sensation to light touch.  Neurological: She is alert and oriented to person, place, and time.  Skin: No rash noted. She is not diaphoretic.  Psychiatric: She has a normal mood and affect.        Assessment & Plan:

## 2011-12-28 NOTE — Assessment & Plan Note (Signed)
Secondary to spondylolisthesis evidenced on radiographs. No physical evidence today of functional impairment. Clinic policy no narcotics prescribed at new patient visit. Give trial of tramadol Q8 hours when necessary. Advised increase physical activity, weight loss. May benefit from Neurontin versus trial of SNRI or TCA. Followup after orange card obtained.

## 2011-12-28 NOTE — Assessment & Plan Note (Signed)
Likely secondary to diabetes. Will check B12, folate and basic labs at next visit. Will discuss trial of Lyrica or Neurontin at this time.

## 2011-12-28 NOTE — Assessment & Plan Note (Signed)
Refill levothyroxin. Stable on this dose >1 year. Due for TSH. Followup one month.

## 2012-01-29 ENCOUNTER — Ambulatory Visit (INDEPENDENT_AMBULATORY_CARE_PROVIDER_SITE_OTHER): Payer: Self-pay | Admitting: Family Medicine

## 2012-01-29 ENCOUNTER — Encounter: Payer: Self-pay | Admitting: Family Medicine

## 2012-01-29 VITALS — BP 138/82 | HR 88 | Temp 98.4°F | Ht 62.0 in | Wt 187.0 lb

## 2012-01-29 DIAGNOSIS — E1149 Type 2 diabetes mellitus with other diabetic neurological complication: Secondary | ICD-10-CM

## 2012-01-29 DIAGNOSIS — M549 Dorsalgia, unspecified: Secondary | ICD-10-CM

## 2012-01-29 DIAGNOSIS — I10 Essential (primary) hypertension: Secondary | ICD-10-CM

## 2012-01-29 DIAGNOSIS — G629 Polyneuropathy, unspecified: Secondary | ICD-10-CM

## 2012-01-29 DIAGNOSIS — E1142 Type 2 diabetes mellitus with diabetic polyneuropathy: Secondary | ICD-10-CM

## 2012-01-29 DIAGNOSIS — G8929 Other chronic pain: Secondary | ICD-10-CM

## 2012-01-29 DIAGNOSIS — G609 Hereditary and idiopathic neuropathy, unspecified: Secondary | ICD-10-CM

## 2012-01-29 DIAGNOSIS — E1140 Type 2 diabetes mellitus with diabetic neuropathy, unspecified: Secondary | ICD-10-CM

## 2012-01-29 DIAGNOSIS — E039 Hypothyroidism, unspecified: Secondary | ICD-10-CM

## 2012-01-29 LAB — COMPREHENSIVE METABOLIC PANEL
Albumin: 4.5 g/dL (ref 3.5–5.2)
Alkaline Phosphatase: 75 U/L (ref 39–117)
BUN: 19 mg/dL (ref 6–23)
Creat: 0.61 mg/dL (ref 0.50–1.10)
Glucose, Bld: 124 mg/dL — ABNORMAL HIGH (ref 70–99)
Potassium: 4.3 mEq/L (ref 3.5–5.3)
Total Bilirubin: 0.3 mg/dL (ref 0.3–1.2)

## 2012-01-29 LAB — CBC
Hemoglobin: 13.2 g/dL (ref 12.0–15.0)
MCH: 30 pg (ref 26.0–34.0)
RBC: 4.4 MIL/uL (ref 3.87–5.11)

## 2012-01-29 LAB — TSH: TSH: 6.757 u[IU]/mL — ABNORMAL HIGH (ref 0.350–4.500)

## 2012-01-29 MED ORDER — PREGABALIN 50 MG PO CAPS: 50.0000 mg | ORAL_CAPSULE | Freq: Three times a day (TID) | ORAL | Status: DC

## 2012-01-29 NOTE — Assessment & Plan Note (Signed)
Due for TSH, will check today. No physcial signs of derangement.  Wt Readings from Last 3 Encounters:  01/29/12 187 lb (84.823 kg)  12/28/11 190 lb (86.183 kg)  09/14/11 180 lb (81.647 kg)

## 2012-01-29 NOTE — Assessment & Plan Note (Signed)
Will check A1c today. With poorly controlled neuropathy, suspect she will need to escalate metformin or add second agent. Continue ACEi, discussed foot care and importance of daily exercise with her chronic conditions. F/u in 2-4 weeks. Plan nutrition referral once orange card approved.

## 2012-01-29 NOTE — Assessment & Plan Note (Signed)
Improved near goal today on medications. Will check labs. Plan to increase HCTZ to full dose 25 mg, consider adding norvasc or beta blocker if still above goal.

## 2012-01-29 NOTE — Assessment & Plan Note (Signed)
Encouraged daily exercise, weight loss. Will avoid narcotics and NSAIDs. Start trial of lyrica. Follow up in 2-4 weeks, consider PT at next visit.

## 2012-01-29 NOTE — Patient Instructions (Signed)
NIce to see you again. Will check labs, I will call if we need to make changes. You can start lyrica for nerve pain in feet. MAKE SURE TO GET THE ORANGE CARD, so Medications will be cheaper. Make an appointment in 2-4 weeks for check up. Take care of your feet. You must exercise for one hour daily to maintain healthy weight. Will refer you nutritionist when you get orange card.   Diabetes and Exercise Regular exercise is important and can help:   Control blood glucose (sugar).   Decrease blood pressure.    Control blood lipids (cholesterol, triglycerides).   Improve overall health.  BENEFITS FROM EXERCISE  Improved fitness.   Improved flexibility.   Improved endurance.   Increased bone density.   Weight control.   Increased muscle strength.   Decreased body fat.   Improvement of the body's use of insulin, a hormone.   Increased insulin sensitivity.   Reduction of insulin needs.   Reduced stress and tension.   Helps you feel better.  People with diabetes who add exercise to their lifestyle gain additional benefits, including:  Weight loss.   Reduced appetite.   Improvement of the body's use of blood glucose.   Decreased risk factors for heart disease:   Lowering of cholesterol and triglycerides.   Raising the level of good cholesterol (high-density lipoproteins, HDL).   Lowering blood sugar.   Decreased blood pressure.  TYPE 1 DIABETES AND EXERCISE  Exercise will usually lower your blood glucose.   If blood glucose is greater than 240 mg/dl, check urine ketones. If ketones are present, do not exercise.   Location of the insulin injection sites may need to be adjusted with exercise. Avoid injecting insulin into areas of the body that will be exercised. For example, avoid injecting insulin into:   The arms when playing tennis.   The legs when jogging. For more information, discuss this with your caregiver.   Keep a record of:   Food intake.     Type and amount of exercise.   Expected peak times of insulin action.   Blood glucose levels.  Do this before, during, and after exercise. Review your records with your caregiver. This will help you to develop guidelines for adjusting food intake and insulin amounts.  TYPE 2 DIABETES AND EXERCISE  Regular physical activity can help control blood glucose.   Exercise is important because it may:   Increase the body's sensitivity to insulin.   Improve blood glucose control.   Exercise reduces the risk of heart disease. It decreases serum cholesterol and triglycerides. It also lowers blood pressure.   Those who take insulin or oral hypoglycemic agents should watch for signs of hypoglycemia. These signs include dizziness, shaking, sweating, chills, and confusion.   Body water is lost during exercise. It must be replaced. This will help to avoid loss of body fluids (dehydration) or heat stroke.  Be sure to talk to your caregiver before starting an exercise program to make sure it is safe for you. Remember, any activity is better than none.  Document Released: 08/01/2003 Document Revised: 04/30/2011 Document Reviewed: 11/15/2008 [facility] Patient Information 2012 [place], [place].

## 2012-01-29 NOTE — Assessment & Plan Note (Signed)
Most likely diabetic, less likely related to deficiency or spine problems with the distribution. Will check A1c, TSH, B12, folate today. Will give trial of lyrica. She may be unable to afford this without financial assistance, so I advised her to get the [facility] card taken care of and she can [facility].

## 2012-01-29 NOTE — Progress Notes (Signed)
  Subjective:    Patient ID: Kristin Mann, female    DOB: [birth year], 51 y.o.   MRN: [id number]  HPI  1. HTN. Taking medications benazepril and HCTZ half dose with improved readings. Denies side effects, leg edema, dyspnea, chest pain.  BP Readings from Last 3 Encounters:  01/29/12 138/82  12/28/11 154/92  12/04/11 187/98   2. DM. Hasn't had a1c checked in years. Is compliant with low dose metformin. Does not check CBG, hasn't been to get orange card yet so cannot afford glucometer and strips. Endorses polyuria, fatigue and neuroapathy.  3. Neuropathy. Describes burning pain in bottoms of feet, decreased sensation. Has not taken any medication except tramadol and percocet previously. Tramadol did not help. Denies any foot injuries, swelling.  4. Back pain. Patient is walking around her apartment complex for exercise. She was told by a surgeon she may eventually need surgery. No recent injuries. No leg numbness or radicular pain.   Review of Systems See HPI otherwise negative.  reports that she has been smoking Cigarettes.  She has never used smokeless tobacco.     Objective:   Physical Exam  Vitals reviewed. Constitutional: She is oriented to person, place, and time. She appears well-developed and well-nourished. No distress.  HENT:  Head: Normocephalic and atraumatic.  Mouth/Throat: Oropharynx is clear and moist.  Eyes: EOM are normal. Pupils are equal, round, and reactive to light.  Neck: Normal range of motion. Neck supple.  Cardiovascular: Normal rate, regular rhythm and normal heart sounds.   No murmur heard. Pulmonary/Chest: Effort normal and breath sounds normal. No respiratory distress. She has no wheezes. She has no rales.  Musculoskeletal: She exhibits no edema.       Sits cross legged on exam table. No problems with gait or ascending table.  Feet without injury. Diminished sensation with monofilament bilaterally in soles. Normal gross sensation in arms and legs.    Neurological: She is alert and oriented to person, place, and time. No cranial nerve deficit. Coordination normal.  Skin: No rash noted. She is not diaphoretic.  Psychiatric: She has a normal mood and affect. Her behavior is normal.       Mildly anxious appearing, speaks fast.      Assessment & Plan:

## 2012-02-17 ENCOUNTER — Other Ambulatory Visit: Payer: Self-pay | Admitting: Family Medicine

## 2012-03-10 ENCOUNTER — Ambulatory Visit (INDEPENDENT_AMBULATORY_CARE_PROVIDER_SITE_OTHER): Payer: Self-pay | Admitting: Family Medicine

## 2012-03-10 ENCOUNTER — Encounter: Payer: Self-pay | Admitting: Family Medicine

## 2012-03-10 VITALS — BP 131/91 | HR 77 | Temp 97.6°F | Ht 62.0 in | Wt 185.0 lb

## 2012-03-10 DIAGNOSIS — J019 Acute sinusitis, unspecified: Secondary | ICD-10-CM | POA: Insufficient documentation

## 2012-03-10 DIAGNOSIS — E039 Hypothyroidism, unspecified: Secondary | ICD-10-CM

## 2012-03-10 DIAGNOSIS — J029 Acute pharyngitis, unspecified: Secondary | ICD-10-CM

## 2012-03-10 DIAGNOSIS — Z720 Tobacco use: Secondary | ICD-10-CM

## 2012-03-10 DIAGNOSIS — F17200 Nicotine dependence, unspecified, uncomplicated: Secondary | ICD-10-CM

## 2012-03-10 LAB — POCT RAPID STREP A (OFFICE): Rapid Strep A Screen: NEGATIVE

## 2012-03-10 MED ORDER — LEVOFLOXACIN 500 MG PO TABS: 750.0000 mg | ORAL_TABLET | Freq: Every day | ORAL | Status: DC

## 2012-03-10 MED ORDER — LEVOTHYROXINE SODIUM 175 MCG PO TABS: 175.0000 ug | ORAL_TABLET | Freq: Every day | ORAL | Status: DC

## 2012-03-10 NOTE — Assessment & Plan Note (Signed)
Persistent/worsening symptoms (productive cough, facial pain, sore throat) for the past 8 days. Negative strep. Start course of Levaquin (nonspecific penicillin allergy). Encouraged probiotics while on antibiotics. Follow-up as needed if symptoms worsen or do not improve 1 week after antibiotics.

## 2012-03-10 NOTE — Progress Notes (Signed)
  Subjective:    Patient ID: Kristin Mann, female    DOB: [birth year], 51 y.o.   MRN: [id number]  HPI # Work-in: sore throat, nasal discharge, facial pain since Wednesday 10/09 She thinks symptoms are worsening. 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  She has been taking Alka Seltzer cold plus for her symptoms. It helps her sore throat.   Review of Systems Endorses subjective fevers and chills Denies nausea or changes in appetite Denies abdominal pain  Denies chest pain or difficulty breathing  Allergies, medication, past medical history reviewed.  -Chronic back pain -HTN -Diabetes with neuropathy -Hypothyroidism--reviewed TSH from last month. Elevated. She denies changing dosage of thyroid medication but endorses low energy/fatigue. -Tobacco use     Objective:   Physical Exam GEN: appears uncomfortable HEENT:   Head: mild-moderate maxillary TTP bilaterally   Eyes: erythematous conjunctiva bilaterally; EOMI without pain; no tearing or other discharge   Ears: left pleural effusion without TM erythema; normal R TM   Nose: nasal congestion with scant white-green nasal discharge   Mouth: MMM; no tonsillar adenopathy or exudates; no oropharyngeal erythema NECK: submandibular fullness but without obvious palpable LN PULM: NI WOB; CTAB without w/r/r ABD: soft, NT EXT: no edema; 2-3 sec capillary refill    Assessment & Plan:

## 2012-03-10 NOTE — Assessment & Plan Note (Signed)
She smokes socially, such as when she drinks. She is not interested in quitting at this time.

## 2012-03-10 NOTE — Assessment & Plan Note (Signed)
TSH elevated at last visit. We will increase Synthroid from 150 to 175.

## 2012-03-10 NOTE — Patient Instructions (Addendum)
Take antibiotic for 5 days  Take a yogurt or probiotic daily when on antibiotic  Increase Synthroid to 175 mcg daily  Follow-up in 2 months

## 2012-05-30 ENCOUNTER — Other Ambulatory Visit: Payer: Self-pay | Admitting: Family Medicine

## 2012-07-18 ENCOUNTER — Other Ambulatory Visit: Payer: Self-pay | Admitting: Family Medicine

## 2012-10-06 ENCOUNTER — Ambulatory Visit (INDEPENDENT_AMBULATORY_CARE_PROVIDER_SITE_OTHER): Payer: Self-pay | Admitting: Family Medicine

## 2012-10-06 ENCOUNTER — Encounter: Payer: Self-pay | Admitting: Family Medicine

## 2012-10-06 VITALS — BP 135/79 | HR 79 | Temp 98.2°F | Ht 62.0 in | Wt 184.0 lb

## 2012-10-06 DIAGNOSIS — H9201 Otalgia, right ear: Secondary | ICD-10-CM

## 2012-10-06 DIAGNOSIS — H9209 Otalgia, unspecified ear: Secondary | ICD-10-CM | POA: Insufficient documentation

## 2012-10-06 MED ORDER — CETIRIZINE HCL 10 MG PO TABS: 10.0000 mg | ORAL_TABLET | Freq: Every day | ORAL | Status: AC

## 2012-10-06 NOTE — Progress Notes (Signed)
  Subjective:    Patient ID: Kristin Mann, female    DOB: 15-Jan-1961, 52 y.o.   MRN: [id number]  HPI # SDA. Right ear discomfort.  She feels like her ear is full of water and popping that started last night.  She can hear pulses in her ear sometimes.  She is concerned about infection.   She is concerned this may be due to tooth infection.  She feels like she started having a lot of problems since getting diagnosed with diabetes.   She denies recent swimming or putting anything into her ear.  She has had ear infections in the distant past.   Review of Systems Per HPI Denies fevers, chills, nausea, tooth pain, tooth drainage, decreased hearing Endorses occasional vertigo symptoms/dizziness, chronic cough that is not new   Endorses anxiety, hot flashes "off the chain", terrible stress  Allergies, medication, past medical history reviewed.  Smoking status noted.  Tobacco Hypothyroidism HTN DM with neuropathy  Chronic back pain, spondylolisthesis neck and lumbar spine   History of allergic rhinitis; she has been afraid to take OTC medications due to concern for interactions with blood pressure and is using a humidifier which is not helping  Social history:  She works with [employer], commercial; Data processing manager     Objective:   Physical Exam GEN: NAD; well-nourished; obese HEENT:   Head: [place]/AT   Eyes: normal conjunctiva without injection or tearing   Ears: TM clear bilaterally with good light reflex and without erythema or air-fluid level   Nose: no rhinorrhea, normal turbinates   Mouth: MMM; no tonsillar adenopathy; no oropharyngeal erythema NECK: no LAD; TMJ non-tender no clicking PULM: NI WOB; CTAB without w/r/r CV: RRR, no m/r/g NEURO: moves all extremities well PSYCH: anxious appearing EXT: no edema     Assessment & Plan:

## 2012-10-06 NOTE — Assessment & Plan Note (Signed)
No abnormalities on physical exam but after patient left, discovered she failed hearing test.  I think it is still fine to monitor symptoms based on her having symptoms starting yesterday. She may have Eustachian tube dysfunction with her history of allergic rhinitis, so try allergy medication; Rx for cetirizine given.  Follow-up advised in 1-2 weeks or sooner if needed or she may call if not improved in 2 weeks or not improved and consider audiology referral; she does not have insurance (but pending) so she declines referral at this time.

## 2012-10-06 NOTE — Patient Instructions (Addendum)
Follow-up as needed  Try OTC allergy medication   Make an appointment to see Dr. Cristal Ford to talk about your diabetes and blood pressure

## 2012-10-08 ENCOUNTER — Other Ambulatory Visit: Payer: Self-pay | Admitting: Family Medicine

## 2012-10-10 ENCOUNTER — Other Ambulatory Visit: Payer: Self-pay | Admitting: *Deleted

## 2012-10-10 NOTE — Telephone Encounter (Signed)
Requested Prescriptions   Pending Prescriptions Disp Refills  . levothyroxine (SYNTHROID, LEVOTHROID) 175 MCG tablet 30 tablet 0

## 2012-10-23 ENCOUNTER — Other Ambulatory Visit: Payer: Self-pay | Admitting: Family Medicine

## 2012-11-11 ENCOUNTER — Other Ambulatory Visit: Payer: Self-pay | Admitting: Family Medicine

## 2012-12-01 ENCOUNTER — Other Ambulatory Visit: Payer: Self-pay

## 2012-12-05 ENCOUNTER — Other Ambulatory Visit: Payer: Self-pay | Admitting: Obstetrics and Gynecology

## 2012-12-05 DIAGNOSIS — Z1231 Encounter for screening mammogram for malignant neoplasm of breast: Secondary | ICD-10-CM

## 2012-12-13 ENCOUNTER — Encounter (HOSPITAL_COMMUNITY): Payer: Self-pay

## 2012-12-13 ENCOUNTER — Ambulatory Visit (HOSPITAL_COMMUNITY)
Admission: RE | Admit: 2012-12-13 | Discharge: 2012-12-13 | Disposition: A | Discharge status: Home / Self Care | Payer: Self-pay | Source: Ambulatory Visit | Attending: Obstetrics and Gynecology | Admitting: Obstetrics and Gynecology

## 2012-12-13 VITALS — BP 132/92 | Temp 98.3°F | Ht 62.0 in | Wt 173.2 lb

## 2012-12-13 DIAGNOSIS — Z1231 Encounter for screening mammogram for malignant neoplasm of breast: Secondary | ICD-10-CM

## 2012-12-13 DIAGNOSIS — Z1239 Encounter for other screening for malignant neoplasm of breast: Secondary | ICD-10-CM

## 2012-12-13 NOTE — Patient Instructions (Signed)
Taught Kristin Mann how to perform BSE and gave educational materials to take home. Patient did not need a Pap smear today due to a history of a hysterectomy for benign reasons. Informed patient that she does not need any further Pap smears due to her history of a hysterectomy for benign reasons. Let patient know will follow up with her within the next couple weeks with results by letter or phone. Kristin Mann verbalized understanding. Patient escorted to mammography for a screening mammogram.  Rion Catala, Kathaleen Maser, RN 12:07 PM

## 2012-12-13 NOTE — Progress Notes (Signed)
No complaints today.  Pap Smear:    Pap smear not completed today. Last Pap smear was 06/10/2009 and normal. Per patient has a history of an abnormal Pap smear around 30 years ago that required cryo for follow up. Per patient all Pap smears have been normal since cryo. Patient has a history of a hysterectomy 01/03/2004 for fibroids. Patient does not need any further Pap smears due to her history of a hysterectomy for benign reasons. Last Pap smear result is in EPIC.  Physical exam: Breasts Breasts symmetrical. No skin abnormalities bilateral breasts. No nipple retraction bilateral breasts. No nipple discharge bilateral breasts. No lymphadenopathy. No lumps palpated bilateral breasts. No complaints of pain or tenderness on exam. Patient escorted to mammography for a screening mammogram.        Pelvic/Bimanual No Pap smear completed today since patient has a history of a hysterectomy for benign reasons. Pap smear not indicated per [facility] guidelines.

## 2012-12-14 ENCOUNTER — Other Ambulatory Visit: Payer: Self-pay | Admitting: Family Medicine

## 2012-12-14 NOTE — Telephone Encounter (Signed)
I am happy to refill her metformin. But it appears Kristin Mann has been asked to be seen by a MD to re-evaluate her hypothyroidism the past two times synthroid was refilled.  She has no appointments scheduled currently.  I will refill these important prescriptions because of the danger of abrupt withdrawal.  Could you please call the pt to have her schedule an appointment so we can make sure she's on the right dose of synthroid?  Thank you  - Hazeline Junker, MD

## 2012-12-15 ENCOUNTER — Other Ambulatory Visit: Payer: Self-pay | Admitting: Obstetrics and Gynecology

## 2012-12-15 DIAGNOSIS — R928 Other abnormal and inconclusive findings on diagnostic imaging of breast: Secondary | ICD-10-CM

## 2012-12-19 ENCOUNTER — Telehealth (HOSPITAL_COMMUNITY): Payer: Self-pay | Admitting: *Deleted

## 2012-12-19 NOTE — Telephone Encounter (Signed)
Patient called me today in regards to receiving a [facility] needing additional [facility] wanting to know if [facility] will cover. Let her know [facility] will cover the follow up and that she will need to take her pink [facility] card with her to her appointment. Explained to her they will do a diagnostic mammogram and a possible breast ultrasound. Patient verbalized understanding.

## 2012-12-29 ENCOUNTER — Ambulatory Visit
Admission: RE | Admit: 2012-12-29 | Discharge: 2012-12-29 | Disposition: A | Discharge status: Home / Self Care | Payer: No Typology Code available for payment source | Source: Ambulatory Visit | Attending: Obstetrics and Gynecology | Admitting: Obstetrics and Gynecology

## 2012-12-29 DIAGNOSIS — R928 Other abnormal and inconclusive findings on diagnostic imaging of breast: Secondary | ICD-10-CM

## 2013-01-19 ENCOUNTER — Telehealth: Payer: Self-pay | Admitting: Family Medicine

## 2013-01-19 DIAGNOSIS — I10 Essential (primary) hypertension: Secondary | ICD-10-CM

## 2013-01-19 DIAGNOSIS — E119 Type 2 diabetes mellitus without complications: Secondary | ICD-10-CM

## 2013-01-19 DIAGNOSIS — E039 Hypothyroidism, unspecified: Secondary | ICD-10-CM

## 2013-01-19 NOTE — Telephone Encounter (Signed)
Pt is out of medication and has an appointment on 9/8 with Dr. Jarvis Newcomer. She would like to know can we give her enough medication to get her until her appointment. Metformin, benazepril, and levothyroxine. She was very upset that she has to come in to get these medications because the pharmacy would not give her refill. I did explain that she has a new doctor. JW

## 2013-01-19 NOTE — Telephone Encounter (Signed)
PLease advise regarding refills. Wyatt Haste, RN-BSN

## 2013-01-20 MED ORDER — METFORMIN HCL 500 MG PO TABS: ORAL_TABLET | ORAL | Status: DC

## 2013-01-20 MED ORDER — BENAZEPRIL HCL 40 MG PO TABS: ORAL_TABLET | ORAL | Status: DC

## 2013-01-20 MED ORDER — LEVOTHYROXINE SODIUM 175 MCG PO TABS: ORAL_TABLET | ORAL | Status: DC

## 2013-01-20 NOTE — Telephone Encounter (Signed)
Pt with appt soon. I have reviewed her chart and these refills are appropriate. I have sent them to her pharmacy.  

## 2013-01-20 NOTE — Telephone Encounter (Signed)
Pt with appt soon. I have reviewed her chart and these refills are appropriate. I have sent them to her pharmacy.

## 2013-01-30 ENCOUNTER — Ambulatory Visit (INDEPENDENT_AMBULATORY_CARE_PROVIDER_SITE_OTHER): Payer: Self-pay | Admitting: Family Medicine

## 2013-01-30 ENCOUNTER — Encounter: Payer: Self-pay | Admitting: Family Medicine

## 2013-01-30 VITALS — BP 133/83 | HR 79 | Ht 62.0 in | Wt 170.0 lb

## 2013-01-30 DIAGNOSIS — E039 Hypothyroidism, unspecified: Secondary | ICD-10-CM

## 2013-01-30 DIAGNOSIS — F17200 Nicotine dependence, unspecified, uncomplicated: Secondary | ICD-10-CM

## 2013-01-30 DIAGNOSIS — E1142 Type 2 diabetes mellitus with diabetic polyneuropathy: Secondary | ICD-10-CM

## 2013-01-30 DIAGNOSIS — Z720 Tobacco use: Secondary | ICD-10-CM

## 2013-01-30 DIAGNOSIS — K219 Gastro-esophageal reflux disease without esophagitis: Secondary | ICD-10-CM

## 2013-01-30 DIAGNOSIS — E1149 Type 2 diabetes mellitus with other diabetic neurological complication: Secondary | ICD-10-CM

## 2013-01-30 DIAGNOSIS — E1140 Type 2 diabetes mellitus with diabetic neuropathy, unspecified: Secondary | ICD-10-CM

## 2013-01-30 DIAGNOSIS — E119 Type 2 diabetes mellitus without complications: Secondary | ICD-10-CM

## 2013-01-30 DIAGNOSIS — I10 Essential (primary) hypertension: Secondary | ICD-10-CM

## 2013-01-30 LAB — POCT GLYCOSYLATED HEMOGLOBIN (HGB A1C): Hemoglobin A1C: 6.1

## 2013-01-30 LAB — CBC WITH DIFFERENTIAL/PLATELET
Basophils Absolute: 0 10*3/uL (ref 0.0–0.1)
Lymphocytes Relative: 27 % (ref 12–46)
Lymphs Abs: 2.3 10*3/uL (ref 0.7–4.0)
Neutrophils Relative %: 66 % (ref 43–77)
Platelets: 331 10*3/uL (ref 150–400)
RBC: 4.49 MIL/uL (ref 3.87–5.11)
RDW: 13.8 % (ref 11.5–15.5)
WBC: 8.5 10*3/uL (ref 4.0–10.5)

## 2013-01-30 MED ORDER — BENAZEPRIL HCL 40 MG PO TABS: ORAL_TABLET | ORAL | Status: DC

## 2013-01-30 MED ORDER — METFORMIN HCL 500 MG PO TABS: ORAL_TABLET | ORAL | Status: DC

## 2013-01-30 MED ORDER — CIMETIDINE 200 MG PO TABS: 200.0000 mg | ORAL_TABLET | Freq: Every day | ORAL | Status: AC

## 2013-01-30 MED ORDER — LEVOTHYROXINE SODIUM 175 MCG PO TABS: ORAL_TABLET | ORAL | Status: DC

## 2013-01-30 NOTE — Assessment & Plan Note (Signed)
Drinks only in social situations, does not smoke most days. Is actively trying to quit and declines pharmacologic therapy at this time.

## 2013-01-30 NOTE — Patient Instructions (Signed)
It was so good to meet you! As we discussed, we are going to take some blood for tests to assess the status of your chronic medical conditions. I will contact you when these tests return and we can change medications as needed.   STOP taking hydrochlorothiazide KEEP taking the rest of your medicines as directed RETURN to clinic in 3 months or sooner if you need to address any specific issues.   Keep taking melatonin for sleep and GREAT JOB with quitting cigarettes. Keep it up!

## 2013-01-30 NOTE — Assessment & Plan Note (Signed)
Does not check sugars at home, but does take metformin 500 BID. Will check A1c today, as last was 12 months ago, 6.4. She has been losing some weight from improved diet and exercise.

## 2013-01-30 NOTE — Progress Notes (Signed)
  Subjective:    Patient ID: Kristin Mann, female    DOB: [birth year], 52 y.o.   MRN: [id number]  HPI  Kristin Mann is a 52yo female here for medication refill.   Her complaints at this time are related to chronic lower back pain and insomnia. The lower back pain is due to spondylolisthesis and has not changed since previous, and is largely controlled with tylenol. She has not been sleeping well, mostly difficulty getting to sleep. Has treated with melatonin in the past with good results, but has not recently been trying anything for this. She feels this makes her more anxious during the day.   She continues to smoke, but has a plan to decrease amount of cigarettes she smokes, currently about 1/2 ppd. Declines any adjunctive medical treatment at this time.  Does not have money or insurance to test her blood sugars, but does take metformin every day as directed. Still has numbness and tingling in her feet that comes and goes, limiting walking and other exercise.   Review of Systems  Constitutional: Negative for fever and chills.  HENT: Negative for facial swelling and rhinorrhea.   Respiratory: Negative for cough and wheezing.   Cardiovascular: Negative for chest pain, palpitations and leg swelling.  Gastrointestinal: Negative for nausea, vomiting, abdominal pain, diarrhea, constipation and abdominal distention.  Endocrine: Negative for polydipsia, polyphagia and polyuria.  Musculoskeletal: Positive for back pain and arthralgias. Negative for myalgias and joint swelling.  Neurological: Negative for syncope, light-headedness and headaches.       Objective:   Physical Exam  Constitutional: She is oriented to person, place, and time. She appears well-developed and well-nourished. No distress.  HENT:  Head: Normocephalic and atraumatic.  Mouth/Throat: Oropharynx is clear and moist.  Eyes: EOM are normal. Pupils are equal, round, and reactive to light.  Neck: Normal range of motion. Neck  supple. No thyromegaly present.  Cardiovascular: Normal rate, normal heart sounds and intact distal pulses.   No murmur heard. Pulmonary/Chest: Effort normal and breath sounds normal.  Abdominal: Soft. Bowel sounds are normal. She exhibits no distension. There is no tenderness.  Musculoskeletal: Normal range of motion. She exhibits no edema.  Neurological: She is alert and oriented to person, place, and time. She has normal reflexes. No cranial nerve deficit.  Skin: Skin is warm and dry. No rash noted.  Psychiatric:  Anxious-appearing        Assessment & Plan:  Kristin Mann is a 52 year old female with T2DM, hypothyroidism, HTN, tobacco use, anxiety, and insomnia.

## 2013-01-30 NOTE — Assessment & Plan Note (Signed)
Last TSH 12 months ago, will recheck today on synthroid 175 mcg/day.

## 2013-01-31 LAB — BASIC METABOLIC PANEL
CO2: 29 mEq/L (ref 19–32)
Calcium: 10 mg/dL (ref 8.4–10.5)
Chloride: 101 mEq/L (ref 96–112)
Creat: 0.59 mg/dL (ref 0.50–1.10)
Sodium: 138 mEq/L (ref 135–145)

## 2013-01-31 LAB — LIPID PANEL
Cholesterol: 291 mg/dL — ABNORMAL HIGH (ref 0–200)
HDL: 49 mg/dL (ref >39)
Triglycerides: 471 mg/dL — ABNORMAL HIGH (ref <150)

## 2013-01-31 NOTE — Assessment & Plan Note (Signed)
At goal today (130/80 on manual check after several minutes stationary) Continue current tx

## 2013-03-23 ENCOUNTER — Other Ambulatory Visit: Payer: Self-pay | Admitting: Family Medicine

## 2013-03-23 DIAGNOSIS — E039 Hypothyroidism, unspecified: Secondary | ICD-10-CM

## 2013-03-23 MED ORDER — LEVOTHYROXINE SODIUM 175 MCG PO TABS: ORAL_TABLET | ORAL | Status: DC

## 2013-03-29 ENCOUNTER — Telehealth: Payer: Self-pay

## 2013-03-29 NOTE — Telephone Encounter (Signed)
Patient woke this morning and states she had a "blemish" on her vagina. She states she has had these type of blemishes on herm skin bu never "down there". Would like to speak to a nurse/MD. Please advise.

## 2013-03-30 NOTE — Telephone Encounter (Signed)
Left message that she's needing an office visit and to  return my call if still having questions. Yeimy Brabant, Virgel Bouquet

## 2013-03-31 ENCOUNTER — Telehealth (HOSPITAL_COMMUNITY): Payer: Self-pay | Admitting: *Deleted

## 2013-03-31 NOTE — Telephone Encounter (Signed)
Telephoned patient at home # and left message to return call to [facility] 

## 2013-04-28 ENCOUNTER — Telehealth: Payer: Self-pay | Admitting: *Deleted

## 2013-04-28 DIAGNOSIS — E1140 Type 2 diabetes mellitus with diabetic neuropathy, unspecified: Secondary | ICD-10-CM

## 2013-04-28 NOTE — Telephone Encounter (Signed)
Patient scheduled to come in on 05/02/13 for LDL.Kristin Mann, [name]

## 2013-05-01 ENCOUNTER — Encounter (HOSPITAL_BASED_OUTPATIENT_CLINIC_OR_DEPARTMENT_OTHER): Payer: Self-pay | Admitting: Emergency Medicine

## 2013-05-01 ENCOUNTER — Emergency Department (HOSPITAL_BASED_OUTPATIENT_CLINIC_OR_DEPARTMENT_OTHER)
Admission: EM | Admit: 2013-05-01 | Discharge: 2013-05-01 | Disposition: A | Discharge status: Home / Self Care | Payer: Self-pay | Attending: Emergency Medicine | Admitting: Emergency Medicine

## 2013-05-01 ENCOUNTER — Emergency Department (HOSPITAL_BASED_OUTPATIENT_CLINIC_OR_DEPARTMENT_OTHER): Payer: Self-pay

## 2013-05-01 DIAGNOSIS — E079 Disorder of thyroid, unspecified: Secondary | ICD-10-CM | POA: Insufficient documentation

## 2013-05-01 DIAGNOSIS — M549 Dorsalgia, unspecified: Secondary | ICD-10-CM

## 2013-05-01 DIAGNOSIS — G8929 Other chronic pain: Secondary | ICD-10-CM | POA: Insufficient documentation

## 2013-05-01 DIAGNOSIS — E119 Type 2 diabetes mellitus without complications: Secondary | ICD-10-CM | POA: Insufficient documentation

## 2013-05-01 DIAGNOSIS — Z880 Allergy status to penicillin: Secondary | ICD-10-CM | POA: Insufficient documentation

## 2013-05-01 DIAGNOSIS — Z79899 Other long term (current) drug therapy: Secondary | ICD-10-CM | POA: Insufficient documentation

## 2013-05-01 DIAGNOSIS — Z8619 Personal history of other infectious and parasitic diseases: Secondary | ICD-10-CM | POA: Insufficient documentation

## 2013-05-01 DIAGNOSIS — M545 Low back pain, unspecified: Secondary | ICD-10-CM | POA: Insufficient documentation

## 2013-05-01 DIAGNOSIS — R61 Generalized hyperhidrosis: Secondary | ICD-10-CM | POA: Insufficient documentation

## 2013-05-01 DIAGNOSIS — I10 Essential (primary) hypertension: Secondary | ICD-10-CM | POA: Insufficient documentation

## 2013-05-01 DIAGNOSIS — F17200 Nicotine dependence, unspecified, uncomplicated: Secondary | ICD-10-CM | POA: Insufficient documentation

## 2013-05-01 DIAGNOSIS — M4310 Spondylolisthesis, site unspecified: Secondary | ICD-10-CM | POA: Insufficient documentation

## 2013-05-01 DIAGNOSIS — K259 Gastric ulcer, unspecified as acute or chronic, without hemorrhage or perforation: Secondary | ICD-10-CM | POA: Insufficient documentation

## 2013-05-01 DIAGNOSIS — K219 Gastro-esophageal reflux disease without esophagitis: Secondary | ICD-10-CM | POA: Insufficient documentation

## 2013-05-01 DIAGNOSIS — R32 Unspecified urinary incontinence: Secondary | ICD-10-CM | POA: Insufficient documentation

## 2013-05-01 LAB — URINALYSIS, ROUTINE W REFLEX MICROSCOPIC
Ketones, ur: NEGATIVE mg/dL
Leukocytes, UA: NEGATIVE
Protein, ur: NEGATIVE mg/dL
Urobilinogen, UA: 0.2 mg/dL (ref 0.0–1.0)

## 2013-05-01 MED ORDER — HYDROMORPHONE HCL PF 1 MG/ML IJ SOLN
INTRAMUSCULAR | Status: AC
  Administered 2013-05-01: 1 mg via INTRAMUSCULAR
  Filled 2013-05-01: qty 1

## 2013-05-01 MED ORDER — OXYCODONE-ACETAMINOPHEN 5-325 MG PO TABS: 1.0000 | ORAL_TABLET | Freq: Four times a day (QID) | ORAL | Status: DC | PRN

## 2013-05-01 MED ORDER — HYDROMORPHONE HCL PF 1 MG/ML IJ SOLN
1.0000 mg | Freq: Once | INTRAMUSCULAR | Status: AC
  Administered 2013-05-01: 1 mg via INTRAMUSCULAR

## 2013-05-01 MED ORDER — OXYCODONE-ACETAMINOPHEN 5-325 MG PO TABS: 1.0000 | ORAL_TABLET | Freq: Four times a day (QID) | ORAL | Status: AC | PRN

## 2013-05-01 NOTE — ED Notes (Signed)
Bladder scan completed.  93 ml volume in bladder.

## 2013-05-01 NOTE — ED Notes (Addendum)
Back pain x 3 days. Drove herself here.

## 2013-05-01 NOTE — ED Provider Notes (Signed)
CSN: [id number]     Arrival date & time 05/01/13  1307 History   First MD Initiated Contact with Patient 05/01/13 1318     Chief Complaint  Patient presents with  . Back Pain   (Consider location/radiation/quality/duration/timing/severity/associated sxs/prior Treatment) Patient is a 52 y.o. female presenting with back pain.  Back Pain Location:  Lumbar spine Quality:  Shooting Radiates to:  R thigh Pain severity:  Severe Onset quality:  Gradual Duration:  3 days (chronic, but exacerbated over 3days) Timing:  Constant Progression:  Worsening Chronicity:  Chronic Context comment:  "I think I overdid it at work" Relieved by:  Nothing Worsened by:  Ambulation, bending and twisting Associated symptoms: bladder incontinence (described as not being able to get to bathroom in time secondary to pain. ) and paresthesias (right leg)   Associated symptoms: no bowel incontinence, no dysuria, no fever, no numbness, no perianal numbness and no weakness     Past Medical History  Diagnosis Date  . Thyroid disease   . Hypertension   . Shingles   . Spondylolisthesis   . Chronic neck pain   . Chronic back pain   . Allergy   . Diabetes mellitus 2009  . Gastric ulcer with hemorrhage     anaprox induced  . GERD (gastroesophageal reflux disease)   . Gastric ulcer     NSAID induced   Past Surgical History  Procedure Laterality Date  . Cholecystectomy    . Cesarean section      x3  . Abdominal hysterectomy      partial-residual cervix   Family History  Problem Relation Age of Onset  . Diabetes Mother   . Hyperlipidemia Mother   . Hypertension Mother   . Cancer Father     prostate  . Hyperlipidemia Father   . Hypertension Father   . Hyperlipidemia Sister   . Hypertension Sister    History  Substance Use Topics  . Smoking status: Current Some Day Smoker -- 0 years    Types: Cigarettes  . Smokeless tobacco: Never Used  . Alcohol Use: 3.0 oz/week    5 Glasses of wine per week    OB History   Grav Para Term Preterm Abortions TAB SAB Ect Mult Living   4 3 3  1  1         Review of Systems  Constitutional: Negative for fever.  Gastrointestinal: Negative for bowel incontinence.  Genitourinary: Positive for bladder incontinence (described as not being able to get to bathroom in time secondary to pain. ). Negative for dysuria.  Musculoskeletal: Positive for back pain.  Neurological: Positive for paresthesias (right leg). Negative for weakness and numbness.  All other systems reviewed and are negative.    Allergies  Penicillins and Vicodin  Home Medications   Current Outpatient Rx  Name  Route  Sig  Dispense  Refill  . acetaminophen (TYLENOL) 650 MG CR tablet   Oral   Take 650 mg by mouth every 8 (eight) hours as needed. Patient uses this medication for her arthritis.         . Aspirin-Salicylamide-Caffeine (BC HEADACHE POWDER PO)   Oral   Take 1 packet by mouth daily as needed. Patient used this medication for her pain.         . benazepril (LOTENSIN) 40 MG tablet      TAKE ONE TABLET BY MOUTH ONCE DAILY   90 tablet   3   . cetirizine (ZYRTEC) 10 MG tablet   Oral  Take 1 tablet (10 mg total) by mouth daily.   30 tablet   3   . cimetidine (ACID REDUCER) 200 MG tablet   Oral   Take 1 tablet (200 mg total) by mouth at bedtime.   90 tablet   3   . levothyroxine (SYNTHROID, LEVOTHROID) 175 MCG tablet      TAKE ONE TABLET BY MOUTH ONCE DAILY   30 tablet   5   . Melatonin 3 MG TABS   Oral   Take 1 tablet by mouth at bedtime as needed.         . metFORMIN (GLUCOPHAGE) 500 MG tablet      TAKE ONE TABLET BY MOUTH TWICE DAILY WITH MEALS   180 tablet   3    BP 172/93  Pulse 79  Temp(Src) 98.3 F (36.8 C) (Oral)  Resp 20  Ht 5\' 2"  (1.575 m)  Wt 170 lb (77.111 kg)  BMI 31.09 kg/m2  SpO2 100% Physical Exam  Nursing note and vitals reviewed. Constitutional: She is oriented to person, place, and time. She appears well-developed  and well-nourished. No distress.  Appears uncomfortable  HENT:  Head: Normocephalic and atraumatic.  Eyes: Conjunctivae are normal. No scleral icterus.  Neck: Neck supple.  Cardiovascular: Normal rate and intact distal pulses.   Pulmonary/Chest: Effort normal. No stridor. No respiratory distress.  Abdominal: Normal appearance. She exhibits no distension. There is no tenderness. There is no rebound and no guarding.  Musculoskeletal:       Lumbar back: She exhibits tenderness (paraspinal, low lumbar) and bony tenderness (low lumbar). She exhibits no swelling and no edema.  Neurological: She is alert and oriented to person, place, and time. She has normal strength. Gait (slow) abnormal.  Reflex Scores:      Patellar reflexes are 2+ on the right side and 2+ on the left side. Normal lower extremity strength and sensation.  Skin: Skin is warm and dry. No rash noted.  Psychiatric: She has a normal mood and affect. Her behavior is normal.    ED Course  Procedures (including critical care time) Labs Review Labs Reviewed  URINALYSIS, ROUTINE W REFLEX MICROSCOPIC   Imaging Review Dg Lumbar Spine Complete  05/01/2013   CLINICAL DATA:  Chronic low back pain.  EXAM: LUMBAR SPINE - COMPLETE 4+ VIEW  COMPARISON:  Plain films lumbar spine 11/17/2011.  FINDINGS: Again seen are bilateral L5 pars interarticularis defects resulting in 1.5 cm anterolisthesis L5 on S1, unchanged. There is loss of disc space height at L5-S1. No fracture is identified and alignment is normal except as noted. Loss of disc space height and endplate spurring are seen at L3-4  IMPRESSION: No acute finding.  Bilateral L5 pars interarticularis defects result in 1.5 cm anterolisthesis L5 on S1, unchanged.  Mild loss of disc space height and endplate spurring Z6-1.   Electronically Signed   By: Drusilla Kanner M.D.   On: 05/01/2013 14:40  All radiology studies independently viewed by me.     EKG Interpretation   None       MDM    1. Back pain    52 yo female with hx of chronic back pain and spondylolisthesis presenting with an acute worsening of her low back pain.  She "felt something move" while sitting.  No injuries, but reports that she "overdid it" at work a few days ago.  Reported one episode of bladder incontinence, but described as difficulty making it to the bathroom due to pain and  not as incontinence consistent with overflow incontinence.  Will check bladder scan.  Given history of spondylolisthesis and feeling that "something moved", will check plain film.  IM dilaudid for pain . No other historical or physical exam finding concerning for cauda equina, discitis, or abscess. Urinalysis was normal without blood. No history of kidney stones and symptoms bilateral.    Bladder scan with minimal volume (not checked post void.)  Pain improved with IM dilaudid.  Will dc home with return precautions.   Candyce Churn, MD 05/01/13 [phone]

## 2013-05-02 ENCOUNTER — Other Ambulatory Visit: Payer: Self-pay

## 2013-05-05 ENCOUNTER — Other Ambulatory Visit: Payer: Self-pay

## 2013-07-25 ENCOUNTER — Ambulatory Visit (INDEPENDENT_AMBULATORY_CARE_PROVIDER_SITE_OTHER): Payer: BC Managed Care – PPO | Admitting: Family Medicine

## 2013-07-25 VITALS — BP 150/78 | HR 72 | Temp 98.1°F | Ht 62.0 in | Wt 162.0 lb

## 2013-07-25 DIAGNOSIS — N6310 Unspecified lump in the right breast, unspecified quadrant: Secondary | ICD-10-CM

## 2013-07-25 DIAGNOSIS — Z23 Encounter for immunization: Secondary | ICD-10-CM

## 2013-07-25 DIAGNOSIS — Z1211 Encounter for screening for malignant neoplasm of colon: Secondary | ICD-10-CM

## 2013-07-25 DIAGNOSIS — F17200 Nicotine dependence, unspecified, uncomplicated: Secondary | ICD-10-CM

## 2013-07-25 DIAGNOSIS — G629 Polyneuropathy, unspecified: Secondary | ICD-10-CM

## 2013-07-25 DIAGNOSIS — E1140 Type 2 diabetes mellitus with diabetic neuropathy, unspecified: Secondary | ICD-10-CM

## 2013-07-25 DIAGNOSIS — N63 Unspecified lump in unspecified breast: Secondary | ICD-10-CM

## 2013-07-25 DIAGNOSIS — G609 Hereditary and idiopathic neuropathy, unspecified: Secondary | ICD-10-CM

## 2013-07-25 DIAGNOSIS — Z720 Tobacco use: Secondary | ICD-10-CM

## 2013-07-25 DIAGNOSIS — E1142 Type 2 diabetes mellitus with diabetic polyneuropathy: Secondary | ICD-10-CM

## 2013-07-25 DIAGNOSIS — E1149 Type 2 diabetes mellitus with other diabetic neurological complication: Secondary | ICD-10-CM

## 2013-07-25 DIAGNOSIS — I10 Essential (primary) hypertension: Secondary | ICD-10-CM

## 2013-07-25 DIAGNOSIS — F411 Generalized anxiety disorder: Secondary | ICD-10-CM | POA: Insufficient documentation

## 2013-07-25 LAB — LIPID PANEL
CHOLESTEROL: 238 mg/dL — AB (ref 0–200)
HDL: 58 mg/dL (ref >39)
LDL CALC: 121 mg/dL — AB (ref 0–99)
TRIGLYCERIDES: 293 mg/dL — AB (ref <150)
Total CHOL/HDL Ratio: 4.1 Ratio
VLDL: 59 mg/dL — ABNORMAL HIGH (ref 0–40)

## 2013-07-25 LAB — POCT GLYCOSYLATED HEMOGLOBIN (HGB A1C): HEMOGLOBIN A1C: 6.1

## 2013-07-25 MED ORDER — ALPRAZOLAM 0.5 MG PO TABS: 0.5000 mg | ORAL_TABLET | Freq: Every evening | ORAL | Status: DC | PRN

## 2013-07-25 MED ORDER — AMLODIPINE BESYLATE 5 MG PO TABS: 5.0000 mg | ORAL_TABLET | Freq: Every day | ORAL | Status: DC

## 2013-07-25 NOTE — Progress Notes (Signed)
Patient ID: Kristin Mann, female   DOB: 1960-09-24, 53 y.o.   MRN: [id number]   Subjective:  [name] is a 53 year old female with T2DM, hypothyroidism, HTN, tobacco use, anxiety, and insomnia here for follow up and to discuss anxiety.   She reports anxiety for most of her adult life which has continued to get worse. She was previously treated with xanax per Dr. Ala DachLowman and says this was effective. She has tried SSRIs and buspar in the past with no improvement. This is affecting her sleep and has caused her to smoke more cigarettes. Stressors include her boyfriend, someone she believes may be stalking her, and her adult children. She does not feel like she is in danger. She has also been sick with the cold recently. Symptoms include insomnia, palpitations, irritability, and inability to focus.   She has continued eating better and occasionally exercising and is down 22lbs to 162 today She was diagnosed with T2DM in 2011 and had a history of GDM before that time. She does not check her sugars at home, but is compliant with low-dose metformin.    Her synthroid dose has been stable and she denies cold/heat intolerance, skin/hair changes, palpitations, CP, SOB.   Review of Systems:  Per HPI. All other systems reviewed and are negative.    Past Medical History: Patient Active Problem List   Diagnosis Date Noted  . Breast mass, right 07/25/2013  . Anxiety state, unspecified 07/25/2013  . Chronic back pain greater than 3 months duration 12/28/2011  . Hypertension goal BP (blood pressure) < 130/80 12/28/2011  . Diabetes mellitus with neuropathy 12/28/2011  . Peripheral neuropathy 12/28/2011  . Hypothyroidism 12/28/2011  . Tobacco abuse 12/28/2011   Objective:  Physical Exam: BP 150/78  Pulse 72  Temp(Src) 98.1 F (36.7 C) (Oral)  Ht 5\' 2"  (1.575 m)  Wt 162 lb (73.483 kg)  BMI 29.62 kg/m2  Gen:  Overweight 53 y.o. female in NAD HEENT: MMM, EOMI, PERRL, anicteric sclerae CV: RRR, no  MRG, no JVD Resp: Non-labored, CTAB, no wheezes noted Abd: Soft, NTND, BS present, no guarding or organomegaly MSK: No edema noted, full ROM Neuro: Alert and oriented, speech normal Psych: Well-groomed, anxious-appearing, fidgety, occasional tangential speech. No SI, HI Assessment:     Kristin Mann is a 53 y.o. female here for anxiety.     Plan:     See problem list for problem-specific plans.  - Rx xanax 0.5mg  #30 to be taken 1/2 pill at a time not as frequently as once per night. This should last > 2 months.  - Due for 6 month follow up ultrasound of BI RADS 3 lesion on the upper inner right breast.  - Due for 1st colonoscopy.  - Due for lipids. Rx lipitor 40mg  for T2DM.

## 2013-07-27 ENCOUNTER — Telehealth: Payer: Self-pay | Admitting: Family Medicine

## 2013-07-27 ENCOUNTER — Encounter: Payer: Self-pay | Admitting: Family Medicine

## 2013-07-27 DIAGNOSIS — E785 Hyperlipidemia, unspecified: Secondary | ICD-10-CM

## 2013-07-27 MED ORDER — ATORVASTATIN CALCIUM 40 MG PO TABS: 40.0000 mg | ORAL_TABLET | Freq: Every day | ORAL | Status: DC

## 2013-07-27 NOTE — Assessment & Plan Note (Signed)
Decreasing her use slowly. Believes it's very important to quit, but is not quite ready. We will discuss this specifically at her next visit.

## 2013-07-27 NOTE — Assessment & Plan Note (Signed)
No change by palpation. History of trauma to the area. Due for 6 month follow up ultrasound of BI RADS 3 lesion on the upper inner right breast.

## 2013-07-27 NOTE — Telephone Encounter (Signed)
Will start lipitor 40mg  for presence of DM.

## 2013-07-27 NOTE — Telephone Encounter (Signed)
Spoke with patient and she now is wanting to talk to doctor about this medication. Attempted to talk to her but she kept cutting me off to tell me she want to talk to doctor

## 2013-07-27 NOTE — Assessment & Plan Note (Signed)
-   Rx xanax 0.5mg  #30 to be taken 1/2 pill at a time not as frequently as once per night. This should last > 2 months.

## 2013-07-27 NOTE — Assessment & Plan Note (Signed)
Continue lifestyle modification and metformin. A1c 6.1% and stable. Rx lipitor 40mg  for T2DM.

## 2013-07-27 NOTE — Assessment & Plan Note (Signed)
LDL 121. Statin started.

## 2013-07-27 NOTE — Assessment & Plan Note (Signed)
140/80 on manual recheck after being settled in the room for 10 min. Suspect some contribution from anxiety and white coat HTN. Is adherent to medical regimen. Continue lifestyle modifications and recheck at next visit.

## 2013-07-28 ENCOUNTER — Other Ambulatory Visit: Payer: BC Managed Care – PPO

## 2013-08-14 ENCOUNTER — Other Ambulatory Visit: Payer: BC Managed Care – PPO

## 2013-08-15 ENCOUNTER — Telehealth: Payer: Self-pay | Admitting: Family Medicine

## 2013-08-15 NOTE — Telephone Encounter (Signed)
Child jumped on her back yesterday and she can barely walk. Can you send her a pain med? Please advise

## 2013-08-15 NOTE — Telephone Encounter (Signed)
Patient was instructed to schedule SDA.Kristin Mann, Kristin Mann

## 2013-08-16 ENCOUNTER — Ambulatory Visit: Payer: BC Managed Care – PPO

## 2013-09-19 ENCOUNTER — Telehealth: Payer: Self-pay | Admitting: *Deleted

## 2013-09-19 ENCOUNTER — Encounter: Payer: Self-pay | Admitting: Gastroenterology

## 2013-09-19 NOTE — Telephone Encounter (Signed)
Pt called and would like to know where the referral was sent to for her colonoscopy and if there is a co-pay.  Please give her a call back.  Clovis Puamika L Martin, RN

## 2013-09-19 NOTE — Telephone Encounter (Signed)
Spoke with pt and made her aware that Christy from [facility] tried to call and that she can call back to make an appt.  Also she should ask her while on the phone if she knows the copay.  Pt is fine with this and also made an appt to see PCP for stomach issues.  States that she feels like she "pulled a muscle in her groin", but hasn't done anything physical to do this.  Jazmin Hartsell,CMA

## 2013-09-21 ENCOUNTER — Encounter (INDEPENDENT_AMBULATORY_CARE_PROVIDER_SITE_OTHER): Payer: Self-pay

## 2013-09-21 ENCOUNTER — Ambulatory Visit
Admission: RE | Admit: 2013-09-21 | Discharge: 2013-09-21 | Disposition: A | Discharge status: Home / Self Care | Payer: BC Managed Care – PPO | Source: Ambulatory Visit | Attending: Family Medicine | Admitting: Family Medicine

## 2013-09-21 DIAGNOSIS — N6310 Unspecified lump in the right breast, unspecified quadrant: Secondary | ICD-10-CM

## 2013-09-22 ENCOUNTER — Ambulatory Visit (AMBULATORY_SURGERY_CENTER): Payer: Self-pay | Admitting: *Deleted

## 2013-09-22 VITALS — Ht 63.0 in | Wt 164.0 lb

## 2013-09-22 DIAGNOSIS — Z1211 Encounter for screening for malignant neoplasm of colon: Secondary | ICD-10-CM

## 2013-09-22 MED ORDER — MOVIPREP 100 G PO SOLR: ORAL | Status: AC

## 2013-09-22 NOTE — Progress Notes (Signed)
No allergies to eggs or soy. No problems with anesthesia.  Pt given Emmi instructions for colonoscopy  No oxygen use  No diet drug use  No previous colonoscopy  

## 2013-09-25 ENCOUNTER — Ambulatory Visit: Payer: BC Managed Care – PPO | Admitting: Family Medicine

## 2013-09-25 ENCOUNTER — Encounter: Payer: Self-pay | Admitting: Gastroenterology

## 2013-09-29 ENCOUNTER — Telehealth: Payer: Self-pay | Admitting: Gastroenterology

## 2013-09-29 NOTE — Telephone Encounter (Signed)
Pt will come by [facility] 4th floor and pick up coupon for free MoviPrep.

## 2013-10-02 ENCOUNTER — Telehealth: Payer: Self-pay | Admitting: Gastroenterology

## 2013-10-02 NOTE — Telephone Encounter (Signed)
Pt will come by [facility] 4th floor to pick up sample of MoviPrep.

## 2013-10-03 ENCOUNTER — Ambulatory Visit (INDEPENDENT_AMBULATORY_CARE_PROVIDER_SITE_OTHER): Payer: BC Managed Care – PPO | Admitting: Family Medicine

## 2013-10-03 ENCOUNTER — Telehealth: Payer: Self-pay | Admitting: Gastroenterology

## 2013-10-03 ENCOUNTER — Encounter: Payer: Self-pay | Admitting: Family Medicine

## 2013-10-03 VITALS — BP 134/87 | HR 80 | Temp 98.4°F | Ht 63.0 in | Wt 160.4 lb

## 2013-10-03 DIAGNOSIS — F411 Generalized anxiety disorder: Secondary | ICD-10-CM

## 2013-10-03 DIAGNOSIS — M4310 Spondylolisthesis, site unspecified: Secondary | ICD-10-CM | POA: Insufficient documentation

## 2013-10-03 DIAGNOSIS — E1142 Type 2 diabetes mellitus with diabetic polyneuropathy: Secondary | ICD-10-CM

## 2013-10-03 DIAGNOSIS — Q762 Congenital spondylolisthesis: Secondary | ICD-10-CM

## 2013-10-03 DIAGNOSIS — E1149 Type 2 diabetes mellitus with other diabetic neurological complication: Secondary | ICD-10-CM

## 2013-10-03 DIAGNOSIS — G8929 Other chronic pain: Secondary | ICD-10-CM

## 2013-10-03 DIAGNOSIS — E1140 Type 2 diabetes mellitus with diabetic neuropathy, unspecified: Secondary | ICD-10-CM

## 2013-10-03 DIAGNOSIS — M549 Dorsalgia, unspecified: Secondary | ICD-10-CM

## 2013-10-03 MED ORDER — ALPRAZOLAM 0.5 MG PO TABS: 0.5000 mg | ORAL_TABLET | Freq: Every evening | ORAL | Status: DC | PRN

## 2013-10-03 MED ORDER — BUPROPION HCL ER (XL) 150 MG PO TB24: 150.0000 mg | ORAL_TABLET | Freq: Every day | ORAL | Status: AC

## 2013-10-03 MED ORDER — SIMVASTATIN 10 MG PO TABS: 10.0000 mg | ORAL_TABLET | Freq: Every day | ORAL | Status: AC

## 2013-10-03 NOTE — Assessment & Plan Note (Signed)
Continue dietary modifications and weight loss as well as metformin. Not 3 months yet since last A1c. Substitute simvastatin for lipitor as she did not tolerate this.

## 2013-10-03 NOTE — Progress Notes (Signed)
Patient ID: Kristin Mann, female   DOB: 1961/04/21, 53 y.o.   MRN: [id number]   Subjective:  HPI:   Kristin Mann is a 53 y.o. female with a history of T2DM, anxiety, and spondylolisthesis here for follow up.   T2DM: She has continued to make dietary changes and increasing activity. She has lost a couple pounds since the last visit. No concerns with metformin. She had a friend who had myalgias on lipitor and she stopped taking her lipitor due to myalgias "all over." She is interested in dietary strategies to control cholesterol.   Anxiety: She has had generalized anxiety for most of her life and has seen a therapist in the past, but can no longer afford to go. She says she has a stalker and there are several adolescent men that smoke marijuana and skateboard [place], which disturbs her. She recently broke up with her boyfriend. She has been taking xanax half a pill every night when she gets home to help relax and sleep. She wants more xanax. She has tried prozac and buspar in the past and doesn't want to take anything but xanax. Her daughter took Brewing technologist and it made her "crazy." She doesn't want to take anything that will make her gain weight. She hates taking pills. Has never been on wellbutrin before. No history of seizures.   Back pain: She has had chronic back pain for years related to spondylolisthesis seen on previous imaging. She has been seen by a neurosurgeon, but did not find the risks of back surgery acceptable while she had small kids. Her pain is worsening and is not responsive to any OTC pain medications. Oxycontin has helped in the past and she requests this at this visit. No saddle anesthesia or bowel or bladder incontinence.   Her colonoscopy is scheduled for 5/14 but she will not be able to undergo the procedure because insurance will only pay for half of it. She denies FH of colon cancer, but is aware that this is a very strong recommendation. She  may be able to afford it later in the summer.   Review of Systems:  Per HPI. All other systems reviewed and are negative.    Past Medical History: Patient Active Problem List   Diagnosis Date Noted  . Hyperlipidemia 07/27/2013  . Breast mass, right 07/25/2013  . Anxiety state, unspecified 07/25/2013  . Chronic back pain greater than 3 months duration 12/28/2011  . Hypertension goal BP (blood pressure) < 130/80 12/28/2011  . Diabetes mellitus with neuropathy 12/28/2011  . Peripheral neuropathy 12/28/2011  . Hypothyroidism 12/28/2011  . Tobacco abuse 12/28/2011   Medications: reviewed and updated Current Outpatient Prescriptions  Medication Sig Dispense Refill  . acetaminophen (TYLENOL) 650 MG CR tablet Take 650 mg by mouth every 8 (eight) hours as needed. Patient uses this medication for her arthritis.      Marland Kitchen ALPRAZolam (XANAX) 0.5 MG tablet Take 1 tablet (0.5 mg total) by mouth at bedtime as needed for anxiety.  30 tablet  0  . amLODipine (NORVASC) 5 MG tablet Take 1 tablet (5 mg total) by mouth daily.  90 tablet  1  . Aspirin-Salicylamide-Caffeine (BC HEADACHE POWDER PO) Take 1 packet by mouth daily as needed. Patient used this medication for her pain.      . benazepril (LOTENSIN) 40 MG tablet TAKE ONE TABLET BY MOUTH ONCE DAILY  90 tablet  3  . cetirizine (ZYRTEC) 10 MG tablet Take 1 tablet (  10 mg total) by mouth daily.  30 tablet  3  . cimetidine (ACID REDUCER) 200 MG tablet Take 1 tablet (200 mg total) by mouth at bedtime.  90 tablet  3  . levothyroxine (SYNTHROID, LEVOTHROID) 175 MCG tablet TAKE ONE TABLET BY MOUTH ONCE DAILY  30 tablet  5  . Melatonin 3 MG TABS Take 1 tablet by mouth at bedtime as needed.      . metFORMIN (GLUCOPHAGE) 500 MG tablet TAKE ONE TABLET BY MOUTH TWICE DAILY WITH MEALS  180 tablet  3  . MOVIPREP 100 G SOLR moviprep as directed. No substitutions  1 kit  0  . oxyCODONE-acetaminophen (PERCOCET/ROXICET) 5-325 MG per tablet Take 1 tablet by mouth every 6  (six) hours as needed for severe pain.  10 tablet  0  . atorvastatin (LIPITOR) 40 MG tablet Take 1 tablet (40 mg total) by mouth daily.  90 tablet  3  . [DISCONTINUED] Cimetidine (ACID REDUCER PO) Take 1 tablet by mouth daily. Patient used this medication for acid reflux.       No current facility-administered medications for this visit.    Objective:  Physical Exam: BP 134/87  Pulse 80  Temp(Src) 98.4 F (36.9 C) (Oral)  Ht 5' 3"  (1.6 m)  Wt 160 lb 6.4 oz (72.757 kg)  BMI 28.42 kg/m2  Gen: Fidgety but pleasant 53 y.o. female in NAD HEENT: MMM, EOMI, PERRL, anicteric sclerae CV: RRR, no MRG, no JVD Resp: Non-labored, CTAB, no wheezes noted Abd: Soft, NTND, BS present, no guarding or organomegaly MSK: Palpable drop off at L4-L5. No paraspinal spasm. Full ROM. No edema noted. Neuro: Alert and oriented, speech normal Psych: She is neatly groomed and appropriately dressed. Her speech is increased in tone, rate and rhythm. Mood is anxious with full affect. Thought process is logical and goal directed. Denied suicidal or homicidal ideation.   Assessment:     Kristin Mann is a 53 y.o. female here for anxiety and back pain.     Plan:     See problem list for problem-specific plans.

## 2013-10-03 NOTE — Patient Instructions (Signed)
Thank you for coming in today!  I have referred you to a neurosurgeon. You will need an MRI before seeing them.  Take wellbutrin once a day in the morning. This should help with energy and anxiety.  Stop lipitor and start taking simvastatin (a VERY low dose of a MILD statin).  Congratulations on the weight loss! Keep it up!  As you leave, make an appointment to follow up with me in 3 months.   Take care and seek immediate care sooner if you develop any concerns.  Please feel free to call with any questions or concerns at any time, at [phone]. - Dr. Jarvis NewcomerGrunz

## 2013-10-03 NOTE — Assessment & Plan Note (Signed)
Refilled xanax and started wellbutrin. Recommended therapy and continued management of social situation.

## 2013-10-03 NOTE — Telephone Encounter (Signed)
Yes, charge late cancellation fee.

## 2013-10-03 NOTE — Assessment & Plan Note (Signed)
No recent CT or MRI. Will refer back to neurosurgery as symptoms are escalating and she is more amenable to treatment at this time. MRI ordered. Discussed with patient about pain management. No opioids at this time.

## 2013-10-05 ENCOUNTER — Encounter: Payer: BC Managed Care – PPO | Admitting: Gastroenterology

## 2013-10-11 ENCOUNTER — Other Ambulatory Visit: Payer: Self-pay | Admitting: *Deleted

## 2013-10-11 DIAGNOSIS — E039 Hypothyroidism, unspecified: Secondary | ICD-10-CM

## 2013-10-12 MED ORDER — LEVOTHYROXINE SODIUM 175 MCG PO TABS
ORAL_TABLET | ORAL | Status: DC
Start: ? — End: 2014-04-18

## 2013-10-23 ENCOUNTER — Telehealth: Payer: Self-pay | Admitting: *Deleted

## 2013-10-23 NOTE — Telephone Encounter (Signed)
Spoke with patient and informed her that her MRI has been authorized and set up for Thursday 10/26/2013 @ 1pm arrive @ 12:45pm @ [facility].

## 2013-10-26 ENCOUNTER — Ambulatory Visit (HOSPITAL_COMMUNITY): Admission: RE | Admit: 2013-10-26 | Payer: BC Managed Care – PPO | Source: Ambulatory Visit

## 2013-10-28 ENCOUNTER — Telehealth: Payer: Self-pay | Admitting: Family Medicine

## 2013-10-28 MED ORDER — CYCLOBENZAPRINE HCL 10 MG PO TABS: 10.0000 mg | ORAL_TABLET | Freq: Three times a day (TID) | ORAL | Status: AC | PRN

## 2013-10-28 NOTE — Telephone Encounter (Signed)
Patient called with complaints of upper back pain worse with movement. No chest pain, SOB.  No fever. Rx for flexeril sent.  Advised rest, Tylenol/Ibuprofen as well. Urged follow up with PCP or if worsens visit to urgent care.

## 2014-01-23 ENCOUNTER — Other Ambulatory Visit: Payer: Self-pay | Admitting: *Deleted

## 2014-01-23 DIAGNOSIS — I10 Essential (primary) hypertension: Secondary | ICD-10-CM

## 2014-01-24 MED ORDER — AMLODIPINE BESYLATE 5 MG PO TABS
5.0000 mg | ORAL_TABLET | Freq: Every day | ORAL | Status: AC
Start: 2014-01-24 — End: ?

## 2014-01-31 ENCOUNTER — Encounter: Payer: BC Managed Care – PPO | Admitting: Family Medicine

## 2014-02-12 ENCOUNTER — Other Ambulatory Visit: Payer: Self-pay | Admitting: *Deleted

## 2014-02-12 DIAGNOSIS — F411 Generalized anxiety disorder: Secondary | ICD-10-CM

## 2014-02-12 DIAGNOSIS — E119 Type 2 diabetes mellitus without complications: Secondary | ICD-10-CM

## 2014-02-13 ENCOUNTER — Other Ambulatory Visit: Payer: Self-pay | Admitting: *Deleted

## 2014-02-13 DIAGNOSIS — E119 Type 2 diabetes mellitus without complications: Secondary | ICD-10-CM

## 2014-02-14 ENCOUNTER — Other Ambulatory Visit: Payer: Self-pay | Admitting: Family Medicine

## 2014-02-14 MED ORDER — ALPRAZOLAM 0.5 MG PO TABS: 0.5000 mg | ORAL_TABLET | Freq: Every evening | ORAL | Status: DC | PRN

## 2014-02-14 MED ORDER — METFORMIN HCL 500 MG PO TABS
ORAL_TABLET | ORAL | Status: AC
Start: 2014-02-14 — End: ?

## 2014-02-14 NOTE — Progress Notes (Signed)
LVM for patient to call back. rx is up front for pick up

## 2014-02-14 NOTE — Progress Notes (Signed)
LMOVM informing pt that "the rx you requested is up front ready for pickup". Kristin Mann, [name]

## 2014-02-15 NOTE — Telephone Encounter (Signed)
ALPRAZolam (XANAX) 0.5 MG tablet: 1 tab by mouth at bedtime as needed for anxiety.  #30, no refills called into [facility] pharmacy.  Clovis Pu, RN

## 2014-02-21 ENCOUNTER — Ambulatory Visit: Payer: BC Managed Care – PPO | Admitting: Family Medicine

## 2014-03-26 ENCOUNTER — Encounter: Payer: Self-pay | Admitting: Family Medicine

## 2014-04-18 ENCOUNTER — Other Ambulatory Visit: Payer: Self-pay | Admitting: Family Medicine

## 2014-04-18 DIAGNOSIS — E039 Hypothyroidism, unspecified: Secondary | ICD-10-CM

## 2014-04-18 NOTE — Telephone Encounter (Signed)
Pt called and is needing a refill on her Thyroid medication called in. jw

## 2014-04-21 MED ORDER — LEVOTHYROXINE SODIUM 175 MCG PO TABS: ORAL_TABLET | ORAL | Status: AC

## 2014-04-23 ENCOUNTER — Other Ambulatory Visit: Payer: Self-pay | Admitting: *Deleted

## 2014-04-23 ENCOUNTER — Telehealth: Payer: Self-pay | Admitting: Family Medicine

## 2014-04-23 DIAGNOSIS — F411 Generalized anxiety disorder: Secondary | ICD-10-CM

## 2014-04-23 DIAGNOSIS — I10 Essential (primary) hypertension: Secondary | ICD-10-CM

## 2014-04-23 MED ORDER — BENAZEPRIL HCL 40 MG PO TABS: ORAL_TABLET | ORAL | Status: AC

## 2014-04-23 MED ORDER — ALPRAZOLAM 0.5 MG PO TABS: 0.5000 mg | ORAL_TABLET | ORAL | Status: DC | PRN

## 2014-04-23 NOTE — Telephone Encounter (Signed)
Received refill request for alprazolam and have printed and signed this. Will leave in front of office for pick up.

## 2014-06-22 ENCOUNTER — Other Ambulatory Visit: Payer: Self-pay | Admitting: *Deleted

## 2014-06-22 DIAGNOSIS — F411 Generalized anxiety disorder: Secondary | ICD-10-CM

## 2014-06-22 MED ORDER — ALPRAZOLAM 0.5 MG PO TABS: 0.5000 mg | ORAL_TABLET | ORAL | Status: AC | PRN

## 2014-06-22 NOTE — Telephone Encounter (Signed)
LVM for patient to call back to inform her rx up front for pick up

## 2014-06-22 NOTE — Telephone Encounter (Signed)
Please let Dyllan know I've printed and signed this. It is waiting at the front for her to pick up. Thanks!

## 2014-06-26 ENCOUNTER — Other Ambulatory Visit: Payer: Self-pay | Admitting: Family Medicine

## 2014-06-26 DIAGNOSIS — G44309 Post-traumatic headache, unspecified, not intractable: Secondary | ICD-10-CM

## 2014-06-27 ENCOUNTER — Ambulatory Visit (INDEPENDENT_AMBULATORY_CARE_PROVIDER_SITE_OTHER): Payer: 59

## 2014-06-27 DIAGNOSIS — G44309 Post-traumatic headache, unspecified, not intractable: Secondary | ICD-10-CM

## 2014-06-27 DIAGNOSIS — G238 Other specified degenerative diseases of basal ganglia: Secondary | ICD-10-CM

## 2014-06-27 NOTE — Telephone Encounter (Signed)
Received another refill request for Xanax from [facility].  Per Dr. Randolm IdolFletke; Rx can be faxed.  Rx for Xanax faxed to [facility].  Clovis PuMartin, Kimberlie Csaszar L, RN

## 2014-08-13 ENCOUNTER — Telehealth: Payer: Self-pay | Admitting: *Deleted

## 2014-08-13 NOTE — Telephone Encounter (Signed)
LMOVM for pt to call us back to schedule an appt with pcp for her diabetes. Deseree Blount, CMA  

## 2014-08-23 ENCOUNTER — Other Ambulatory Visit: Payer: Self-pay | Admitting: *Deleted

## 2014-08-23 DIAGNOSIS — F411 Generalized anxiety disorder: Secondary | ICD-10-CM

## 2015-07-19 ENCOUNTER — Other Ambulatory Visit: Payer: Self-pay | Admitting: Family Medicine

## 2015-07-19 DIAGNOSIS — M545 Low back pain, unspecified: Secondary | ICD-10-CM

## 2015-11-18 DIAGNOSIS — M459 Ankylosing spondylitis of unspecified sites in spine: Secondary | ICD-10-CM | POA: Diagnosis not present

## 2015-11-18 DIAGNOSIS — G894 Chronic pain syndrome: Secondary | ICD-10-CM | POA: Diagnosis not present

## 2015-11-18 DIAGNOSIS — I10 Essential (primary) hypertension: Secondary | ICD-10-CM | POA: Diagnosis not present

## 2015-11-18 DIAGNOSIS — E782 Mixed hyperlipidemia: Secondary | ICD-10-CM | POA: Diagnosis not present

## 2015-11-20 ENCOUNTER — Other Ambulatory Visit: Payer: Self-pay | Admitting: Family Medicine

## 2015-11-20 DIAGNOSIS — M549 Dorsalgia, unspecified: Secondary | ICD-10-CM

## 2015-11-20 DIAGNOSIS — M459 Ankylosing spondylitis of unspecified sites in spine: Secondary | ICD-10-CM

## 2015-12-09 ENCOUNTER — Ambulatory Visit (INDEPENDENT_AMBULATORY_CARE_PROVIDER_SITE_OTHER): Payer: BLUE CROSS/BLUE SHIELD

## 2015-12-09 DIAGNOSIS — M4807 Spinal stenosis, lumbosacral region: Secondary | ICD-10-CM | POA: Diagnosis not present

## 2015-12-09 DIAGNOSIS — M4314 Spondylolisthesis, thoracic region: Secondary | ICD-10-CM

## 2015-12-09 DIAGNOSIS — M549 Dorsalgia, unspecified: Secondary | ICD-10-CM

## 2015-12-09 DIAGNOSIS — M459 Ankylosing spondylitis of unspecified sites in spine: Secondary | ICD-10-CM

## 2015-12-09 DIAGNOSIS — M5126 Other intervertebral disc displacement, lumbar region: Secondary | ICD-10-CM

## 2015-12-10 ENCOUNTER — Encounter (HOSPITAL_COMMUNITY): Payer: Self-pay | Admitting: *Deleted

## 2015-12-17 DIAGNOSIS — M4317 Spondylolisthesis, lumbosacral region: Secondary | ICD-10-CM | POA: Diagnosis not present

## 2015-12-27 ENCOUNTER — Other Ambulatory Visit: Payer: Self-pay | Admitting: Neurological Surgery

## 2015-12-27 DIAGNOSIS — M4317 Spondylolisthesis, lumbosacral region: Secondary | ICD-10-CM

## 2015-12-30 ENCOUNTER — Ambulatory Visit (INDEPENDENT_AMBULATORY_CARE_PROVIDER_SITE_OTHER): Payer: BLUE CROSS/BLUE SHIELD

## 2015-12-30 DIAGNOSIS — M4806 Spinal stenosis, lumbar region: Secondary | ICD-10-CM

## 2015-12-30 DIAGNOSIS — M5137 Other intervertebral disc degeneration, lumbosacral region: Secondary | ICD-10-CM | POA: Diagnosis not present

## 2015-12-30 DIAGNOSIS — M5136 Other intervertebral disc degeneration, lumbar region: Secondary | ICD-10-CM | POA: Diagnosis not present

## 2015-12-30 DIAGNOSIS — M4317 Spondylolisthesis, lumbosacral region: Secondary | ICD-10-CM | POA: Diagnosis not present

## 2016-01-16 DIAGNOSIS — M4317 Spondylolisthesis, lumbosacral region: Secondary | ICD-10-CM | POA: Diagnosis not present

## 2016-01-30 DIAGNOSIS — M4317 Spondylolisthesis, lumbosacral region: Secondary | ICD-10-CM | POA: Diagnosis not present

## 2016-02-20 DIAGNOSIS — M4317 Spondylolisthesis, lumbosacral region: Secondary | ICD-10-CM | POA: Diagnosis not present

## 2016-03-16 DIAGNOSIS — M4317 Spondylolisthesis, lumbosacral region: Secondary | ICD-10-CM | POA: Diagnosis not present

## 2016-04-21 DIAGNOSIS — E119 Type 2 diabetes mellitus without complications: Secondary | ICD-10-CM | POA: Diagnosis not present

## 2016-04-21 DIAGNOSIS — I10 Essential (primary) hypertension: Secondary | ICD-10-CM | POA: Diagnosis not present

## 2016-04-21 DIAGNOSIS — E782 Mixed hyperlipidemia: Secondary | ICD-10-CM | POA: Diagnosis not present

## 2016-04-21 DIAGNOSIS — E039 Hypothyroidism, unspecified: Secondary | ICD-10-CM | POA: Diagnosis not present

## 2016-04-23 DIAGNOSIS — E119 Type 2 diabetes mellitus without complications: Secondary | ICD-10-CM | POA: Diagnosis not present

## 2016-04-23 DIAGNOSIS — I10 Essential (primary) hypertension: Secondary | ICD-10-CM | POA: Diagnosis not present

## 2016-04-23 DIAGNOSIS — Z23 Encounter for immunization: Secondary | ICD-10-CM | POA: Diagnosis not present

## 2016-04-23 DIAGNOSIS — E782 Mixed hyperlipidemia: Secondary | ICD-10-CM | POA: Diagnosis not present

## 2016-04-23 DIAGNOSIS — E039 Hypothyroidism, unspecified: Secondary | ICD-10-CM | POA: Diagnosis not present

## 2016-05-27 DIAGNOSIS — I10 Essential (primary) hypertension: Secondary | ICD-10-CM | POA: Diagnosis not present

## 2016-05-27 DIAGNOSIS — M4317 Spondylolisthesis, lumbosacral region: Secondary | ICD-10-CM | POA: Diagnosis not present

## 2016-05-27 DIAGNOSIS — Z6833 Body mass index (BMI) 33.0-33.9, adult: Secondary | ICD-10-CM | POA: Diagnosis not present

## 2016-07-31 DIAGNOSIS — L918 Other hypertrophic disorders of the skin: Secondary | ICD-10-CM | POA: Diagnosis not present

## 2016-07-31 DIAGNOSIS — Z1211 Encounter for screening for malignant neoplasm of colon: Secondary | ICD-10-CM | POA: Diagnosis not present

## 2016-07-31 DIAGNOSIS — R42 Dizziness and giddiness: Secondary | ICD-10-CM | POA: Diagnosis not present

## 2016-07-31 DIAGNOSIS — L989 Disorder of the skin and subcutaneous tissue, unspecified: Secondary | ICD-10-CM | POA: Diagnosis not present

## 2016-08-04 ENCOUNTER — Other Ambulatory Visit: Payer: Self-pay | Admitting: Family Medicine

## 2016-08-04 DIAGNOSIS — Z1231 Encounter for screening mammogram for malignant neoplasm of breast: Secondary | ICD-10-CM

## 2016-09-15 DIAGNOSIS — M4317 Spondylolisthesis, lumbosacral region: Secondary | ICD-10-CM | POA: Diagnosis not present

## 2016-09-15 DIAGNOSIS — G5603 Carpal tunnel syndrome, bilateral upper limbs: Secondary | ICD-10-CM | POA: Diagnosis not present

## 2016-09-15 DIAGNOSIS — M5417 Radiculopathy, lumbosacral region: Secondary | ICD-10-CM | POA: Diagnosis not present

## 2016-11-18 DIAGNOSIS — E782 Mixed hyperlipidemia: Secondary | ICD-10-CM | POA: Diagnosis not present

## 2016-11-18 DIAGNOSIS — I10 Essential (primary) hypertension: Secondary | ICD-10-CM | POA: Diagnosis not present

## 2016-11-18 DIAGNOSIS — E119 Type 2 diabetes mellitus without complications: Secondary | ICD-10-CM | POA: Diagnosis not present

## 2016-11-18 DIAGNOSIS — F411 Generalized anxiety disorder: Secondary | ICD-10-CM | POA: Diagnosis not present

## 2016-11-18 DIAGNOSIS — E039 Hypothyroidism, unspecified: Secondary | ICD-10-CM | POA: Diagnosis not present

## 2017-01-20 DIAGNOSIS — M4317 Spondylolisthesis, lumbosacral region: Secondary | ICD-10-CM | POA: Diagnosis not present

## 2017-01-20 DIAGNOSIS — G5603 Carpal tunnel syndrome, bilateral upper limbs: Secondary | ICD-10-CM | POA: Diagnosis not present

## 2017-01-28 DIAGNOSIS — M5417 Radiculopathy, lumbosacral region: Secondary | ICD-10-CM | POA: Diagnosis not present

## 2017-01-28 DIAGNOSIS — M4317 Spondylolisthesis, lumbosacral region: Secondary | ICD-10-CM | POA: Diagnosis not present

## 2017-02-11 DIAGNOSIS — G5603 Carpal tunnel syndrome, bilateral upper limbs: Secondary | ICD-10-CM | POA: Diagnosis not present

## 2017-03-19 DIAGNOSIS — M722 Plantar fascial fibromatosis: Secondary | ICD-10-CM | POA: Diagnosis not present

## 2017-04-07 DIAGNOSIS — M4317 Spondylolisthesis, lumbosacral region: Secondary | ICD-10-CM | POA: Diagnosis not present

## 2017-04-07 DIAGNOSIS — M5417 Radiculopathy, lumbosacral region: Secondary | ICD-10-CM | POA: Diagnosis not present

## 2017-04-07 DIAGNOSIS — G5603 Carpal tunnel syndrome, bilateral upper limbs: Secondary | ICD-10-CM | POA: Diagnosis not present

## 2017-06-04 DIAGNOSIS — E782 Mixed hyperlipidemia: Secondary | ICD-10-CM | POA: Diagnosis not present

## 2017-06-04 DIAGNOSIS — E039 Hypothyroidism, unspecified: Secondary | ICD-10-CM | POA: Diagnosis not present

## 2017-06-04 DIAGNOSIS — I10 Essential (primary) hypertension: Secondary | ICD-10-CM | POA: Diagnosis not present

## 2017-06-04 DIAGNOSIS — E119 Type 2 diabetes mellitus without complications: Secondary | ICD-10-CM | POA: Diagnosis not present

## 2017-06-28 DIAGNOSIS — H1013 Acute atopic conjunctivitis, bilateral: Secondary | ICD-10-CM | POA: Diagnosis not present

## 2017-06-28 DIAGNOSIS — J069 Acute upper respiratory infection, unspecified: Secondary | ICD-10-CM | POA: Diagnosis not present

## 2017-07-21 DIAGNOSIS — M4317 Spondylolisthesis, lumbosacral region: Secondary | ICD-10-CM | POA: Diagnosis not present

## 2017-07-21 DIAGNOSIS — M5417 Radiculopathy, lumbosacral region: Secondary | ICD-10-CM | POA: Diagnosis not present

## 2017-07-21 DIAGNOSIS — G5603 Carpal tunnel syndrome, bilateral upper limbs: Secondary | ICD-10-CM | POA: Diagnosis not present

## 2017-09-22 DIAGNOSIS — J209 Acute bronchitis, unspecified: Secondary | ICD-10-CM | POA: Diagnosis not present

## 2017-10-15 DIAGNOSIS — M5417 Radiculopathy, lumbosacral region: Secondary | ICD-10-CM | POA: Diagnosis not present

## 2017-10-15 DIAGNOSIS — G5603 Carpal tunnel syndrome, bilateral upper limbs: Secondary | ICD-10-CM | POA: Diagnosis not present

## 2017-10-15 DIAGNOSIS — M4317 Spondylolisthesis, lumbosacral region: Secondary | ICD-10-CM | POA: Diagnosis not present

## 2018-01-18 DIAGNOSIS — G5603 Carpal tunnel syndrome, bilateral upper limbs: Secondary | ICD-10-CM | POA: Diagnosis not present

## 2018-01-18 DIAGNOSIS — M4317 Spondylolisthesis, lumbosacral region: Secondary | ICD-10-CM | POA: Diagnosis not present

## 2018-02-09 DIAGNOSIS — I10 Essential (primary) hypertension: Secondary | ICD-10-CM | POA: Diagnosis not present

## 2018-02-09 DIAGNOSIS — E782 Mixed hyperlipidemia: Secondary | ICD-10-CM | POA: Diagnosis not present

## 2018-02-09 DIAGNOSIS — E119 Type 2 diabetes mellitus without complications: Secondary | ICD-10-CM | POA: Diagnosis not present

## 2018-02-09 DIAGNOSIS — E039 Hypothyroidism, unspecified: Secondary | ICD-10-CM | POA: Diagnosis not present

## 2018-02-10 ENCOUNTER — Other Ambulatory Visit: Payer: Self-pay | Admitting: Physician Assistant

## 2018-02-10 DIAGNOSIS — Z1231 Encounter for screening mammogram for malignant neoplasm of breast: Secondary | ICD-10-CM

## 2018-03-17 ENCOUNTER — Ambulatory Visit
Admission: RE | Admit: 2018-03-17 | Discharge: 2018-03-17 | Disposition: A | Discharge status: Home / Self Care | Payer: BLUE CROSS/BLUE SHIELD | Source: Ambulatory Visit | Attending: Physician Assistant | Admitting: Physician Assistant

## 2018-03-17 DIAGNOSIS — Z1231 Encounter for screening mammogram for malignant neoplasm of breast: Secondary | ICD-10-CM

## 2018-04-20 DIAGNOSIS — G5603 Carpal tunnel syndrome, bilateral upper limbs: Secondary | ICD-10-CM | POA: Diagnosis not present

## 2018-04-20 DIAGNOSIS — M4317 Spondylolisthesis, lumbosacral region: Secondary | ICD-10-CM | POA: Diagnosis not present

## 2018-07-04 DIAGNOSIS — N63 Unspecified lump in unspecified breast: Secondary | ICD-10-CM | POA: Diagnosis not present

## 2018-07-07 ENCOUNTER — Other Ambulatory Visit: Payer: Self-pay | Admitting: Physician Assistant

## 2018-07-07 ENCOUNTER — Other Ambulatory Visit: Payer: Self-pay | Admitting: Anesthesiology

## 2018-07-07 DIAGNOSIS — N6320 Unspecified lump in the left breast, unspecified quadrant: Secondary | ICD-10-CM

## 2018-07-15 ENCOUNTER — Other Ambulatory Visit: Payer: BLUE CROSS/BLUE SHIELD

## 2018-07-25 ENCOUNTER — Ambulatory Visit
Admission: RE | Admit: 2018-07-25 | Discharge: 2018-07-25 | Disposition: A | Discharge status: Home / Self Care | Payer: BLUE CROSS/BLUE SHIELD | Source: Ambulatory Visit | Attending: Physician Assistant | Admitting: Physician Assistant

## 2018-07-25 DIAGNOSIS — R928 Other abnormal and inconclusive findings on diagnostic imaging of breast: Secondary | ICD-10-CM | POA: Diagnosis not present

## 2018-07-25 DIAGNOSIS — N6320 Unspecified lump in the left breast, unspecified quadrant: Secondary | ICD-10-CM

## 2018-07-25 DIAGNOSIS — N6002 Solitary cyst of left breast: Secondary | ICD-10-CM | POA: Diagnosis not present

## 2018-07-25 HISTORY — DX: Unspecified lump in unspecified breast: N63.0

## 2018-08-11 DIAGNOSIS — M79675 Pain in left toe(s): Secondary | ICD-10-CM | POA: Diagnosis not present

## 2018-08-11 DIAGNOSIS — Z1211 Encounter for screening for malignant neoplasm of colon: Secondary | ICD-10-CM | POA: Diagnosis not present

## 2018-08-11 DIAGNOSIS — F411 Generalized anxiety disorder: Secondary | ICD-10-CM | POA: Diagnosis not present

## 2018-08-11 DIAGNOSIS — L03032 Cellulitis of left toe: Secondary | ICD-10-CM | POA: Diagnosis not present

## 2018-08-16 DIAGNOSIS — M4317 Spondylolisthesis, lumbosacral region: Secondary | ICD-10-CM | POA: Diagnosis not present

## 2018-11-11 DIAGNOSIS — R221 Localized swelling, mass and lump, neck: Secondary | ICD-10-CM | POA: Diagnosis not present

## 2018-11-16 DIAGNOSIS — M4317 Spondylolisthesis, lumbosacral region: Secondary | ICD-10-CM | POA: Diagnosis not present

## 2018-11-16 DIAGNOSIS — G5603 Carpal tunnel syndrome, bilateral upper limbs: Secondary | ICD-10-CM | POA: Diagnosis not present

## 2018-11-21 DIAGNOSIS — R221 Localized swelling, mass and lump, neck: Secondary | ICD-10-CM | POA: Diagnosis not present

## 2018-11-21 DIAGNOSIS — F17200 Nicotine dependence, unspecified, uncomplicated: Secondary | ICD-10-CM | POA: Diagnosis not present

## 2019-02-15 ENCOUNTER — Other Ambulatory Visit: Payer: Self-pay | Admitting: Physician Assistant

## 2019-02-15 DIAGNOSIS — M5417 Radiculopathy, lumbosacral region: Secondary | ICD-10-CM | POA: Diagnosis not present

## 2019-02-15 DIAGNOSIS — F1120 Opioid dependence, uncomplicated: Secondary | ICD-10-CM | POA: Diagnosis not present

## 2019-02-15 DIAGNOSIS — G5603 Carpal tunnel syndrome, bilateral upper limbs: Secondary | ICD-10-CM | POA: Diagnosis not present

## 2019-02-15 DIAGNOSIS — Z1231 Encounter for screening mammogram for malignant neoplasm of breast: Secondary | ICD-10-CM

## 2019-02-15 DIAGNOSIS — M4317 Spondylolisthesis, lumbosacral region: Secondary | ICD-10-CM | POA: Diagnosis not present

## 2019-03-30 DIAGNOSIS — E782 Mixed hyperlipidemia: Secondary | ICD-10-CM | POA: Diagnosis not present

## 2019-03-30 DIAGNOSIS — E119 Type 2 diabetes mellitus without complications: Secondary | ICD-10-CM | POA: Diagnosis not present

## 2019-03-30 DIAGNOSIS — E039 Hypothyroidism, unspecified: Secondary | ICD-10-CM | POA: Diagnosis not present

## 2019-03-30 DIAGNOSIS — E781 Pure hyperglyceridemia: Secondary | ICD-10-CM | POA: Diagnosis not present

## 2019-03-30 DIAGNOSIS — I10 Essential (primary) hypertension: Secondary | ICD-10-CM | POA: Diagnosis not present

## 2019-04-03 ENCOUNTER — Other Ambulatory Visit: Payer: Self-pay

## 2019-04-03 ENCOUNTER — Ambulatory Visit
Admission: RE | Admit: 2019-04-03 | Discharge: 2019-04-03 | Disposition: A | Discharge status: Home / Self Care | Payer: BLUE CROSS/BLUE SHIELD | Source: Ambulatory Visit | Attending: Physician Assistant | Admitting: Physician Assistant

## 2019-04-03 DIAGNOSIS — Z1231 Encounter for screening mammogram for malignant neoplasm of breast: Secondary | ICD-10-CM

## 2019-04-10 DIAGNOSIS — E119 Type 2 diabetes mellitus without complications: Secondary | ICD-10-CM | POA: Diagnosis not present

## 2019-04-10 DIAGNOSIS — E039 Hypothyroidism, unspecified: Secondary | ICD-10-CM | POA: Diagnosis not present

## 2019-04-10 DIAGNOSIS — I10 Essential (primary) hypertension: Secondary | ICD-10-CM | POA: Diagnosis not present

## 2019-04-10 DIAGNOSIS — E782 Mixed hyperlipidemia: Secondary | ICD-10-CM | POA: Diagnosis not present

## 2019-05-09 DIAGNOSIS — Z6834 Body mass index (BMI) 34.0-34.9, adult: Secondary | ICD-10-CM | POA: Diagnosis not present

## 2019-05-09 DIAGNOSIS — I10 Essential (primary) hypertension: Secondary | ICD-10-CM | POA: Diagnosis not present

## 2019-05-09 DIAGNOSIS — M4317 Spondylolisthesis, lumbosacral region: Secondary | ICD-10-CM | POA: Diagnosis not present

## 2019-09-11 IMAGING — MG DIGITAL DIAGNOSTIC UNILATERAL LEFT MAMMOGRAM WITH TOMO AND CAD
6 series · 6 of 18 positions shown · non-contrast
Comparison: Previous exam(s).

CLINICAL DATA: Palpable lump left breast

EXAM:
DIGITAL DIAGNOSTIC LEFT MAMMOGRAM WITH CAD AND TOMO
ULTRASOUND LEFT BREAST

[L MLO synth-2D]
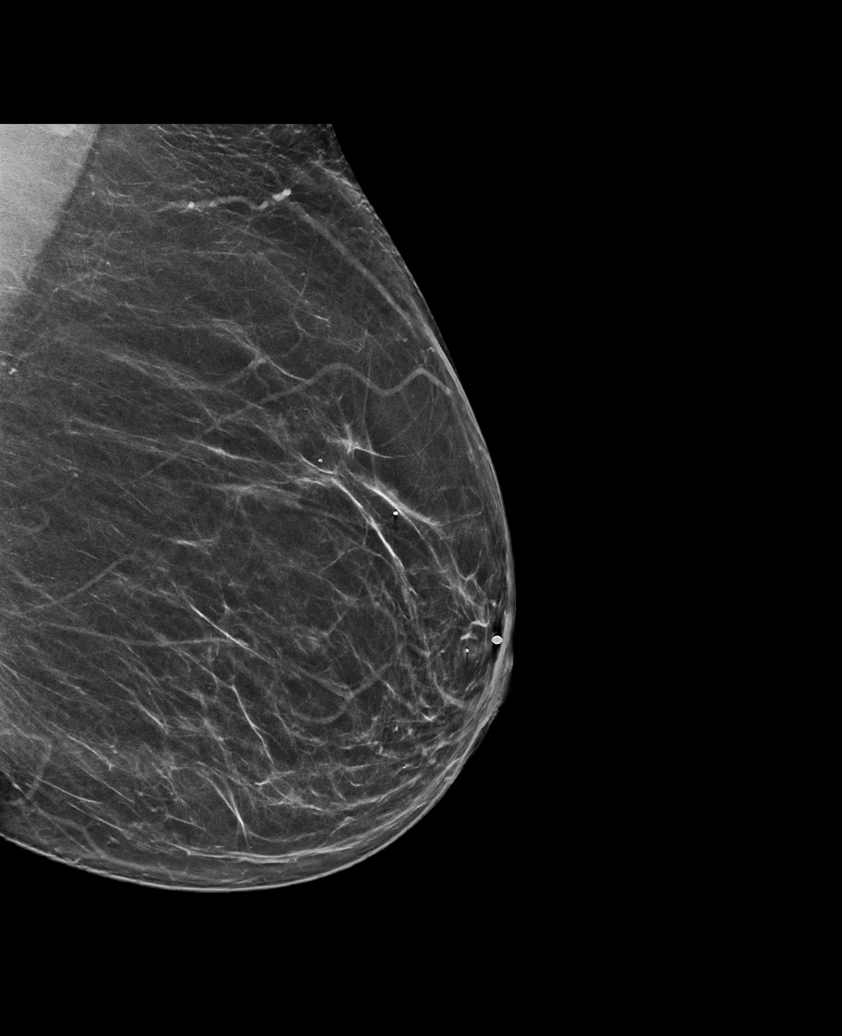

[L TAN synth-2D]
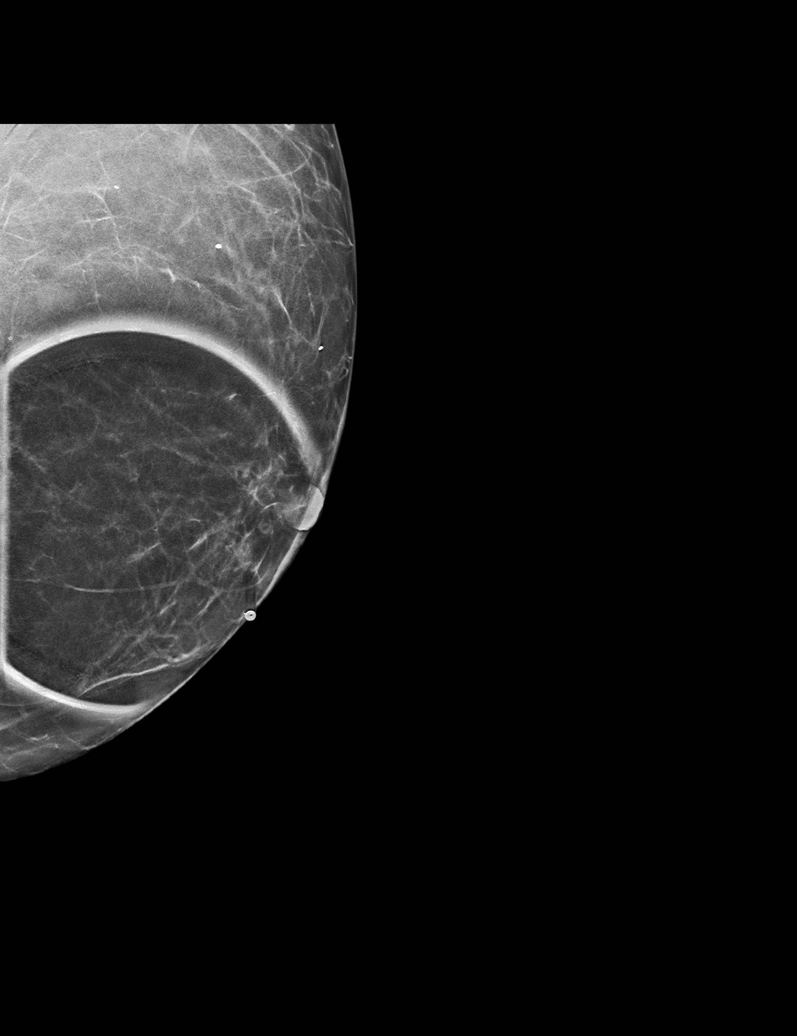

[L CC synth-2D]
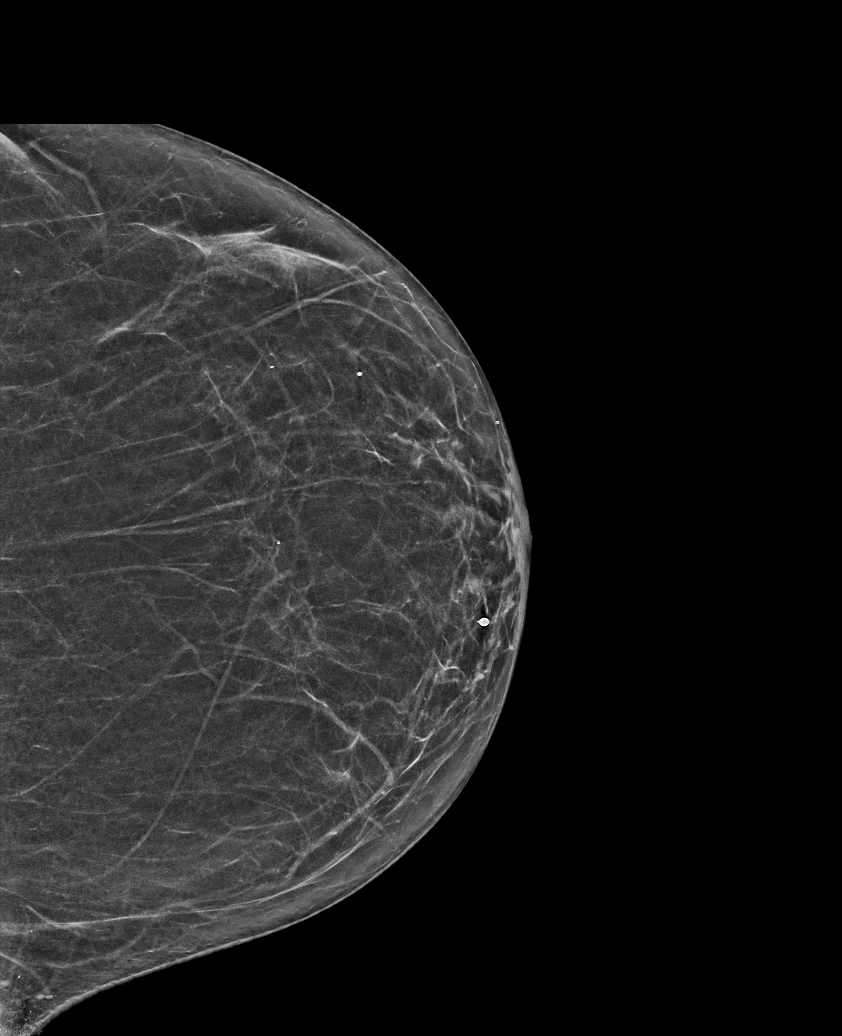

[L MLO tomo · tomo slice 37/72.0]
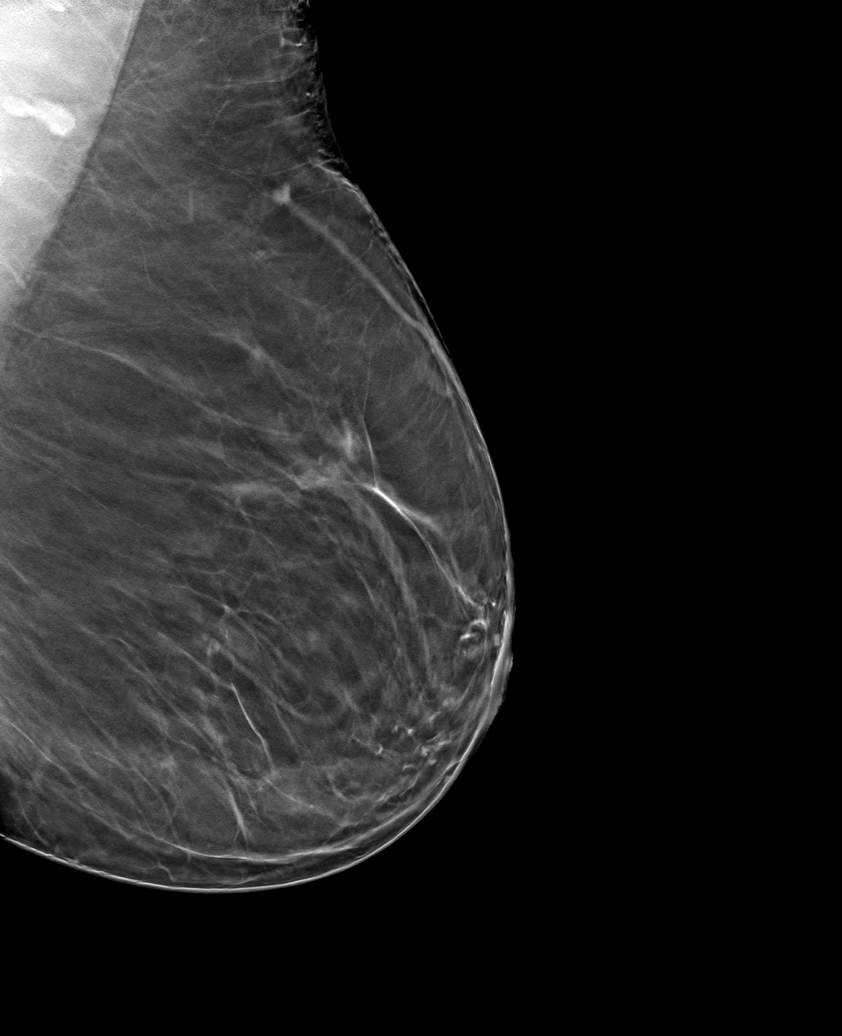

[L TAN tomo · tomo slice 23/45.0]
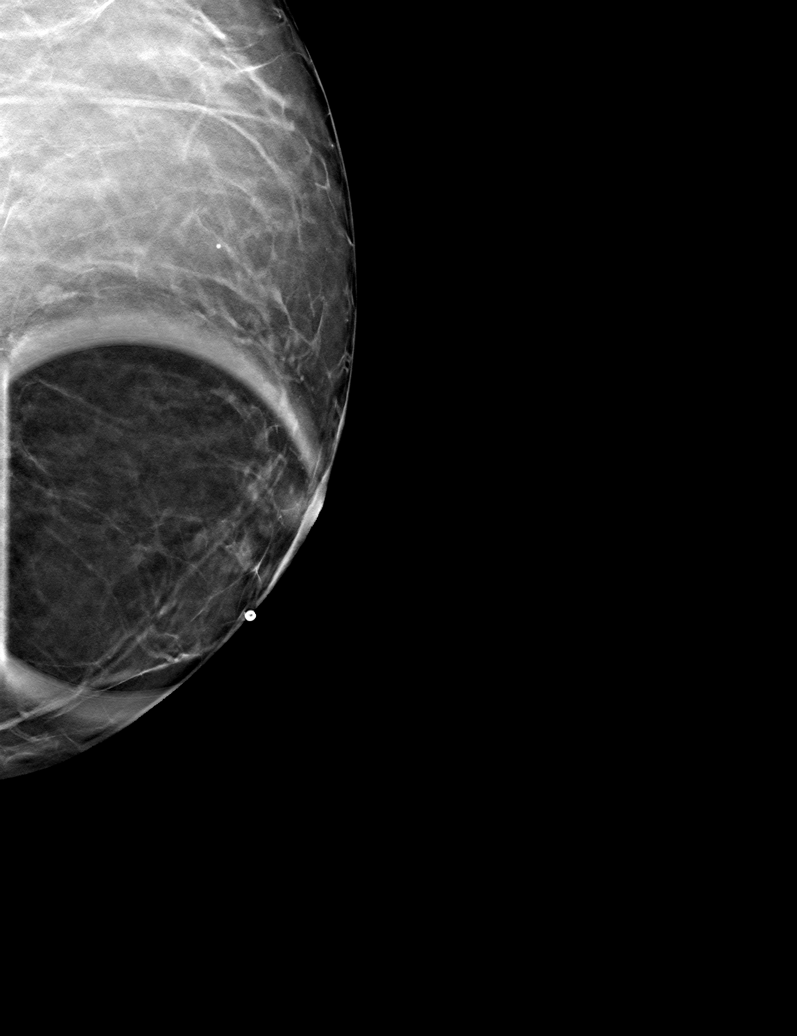

[L CC tomo · tomo slice 31/61.0]
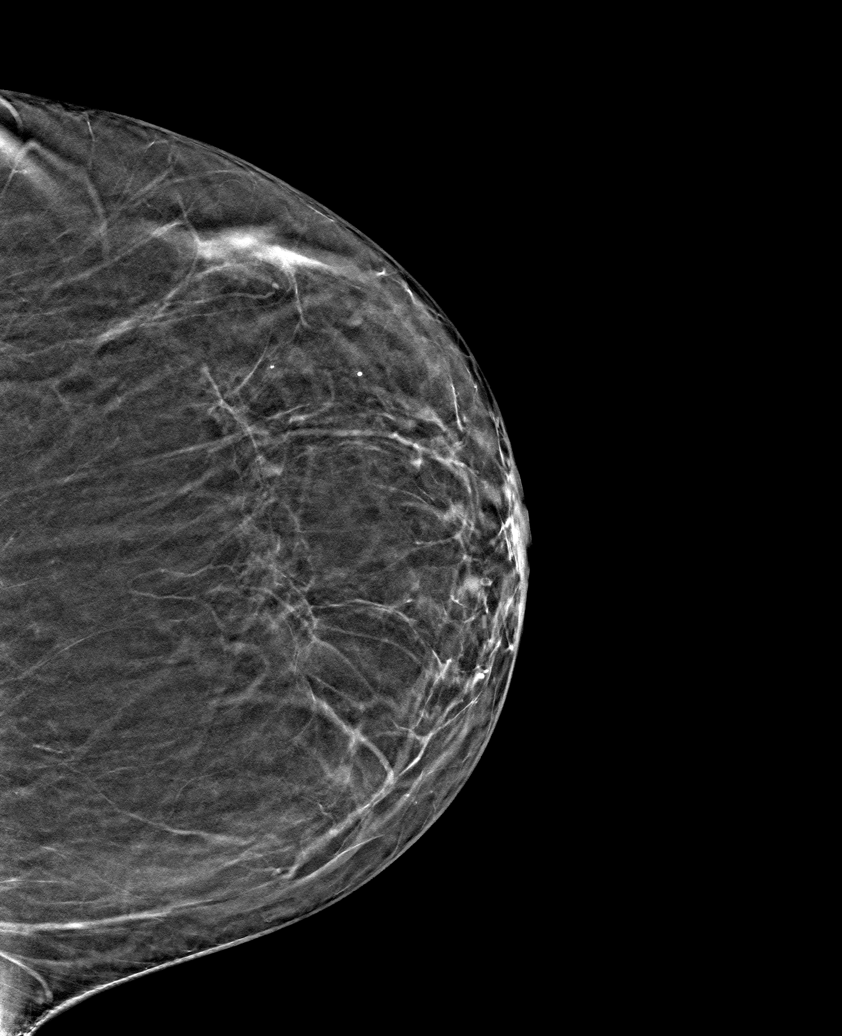

[6 of 18 positions shown; findings below may reference images not displayed]

ACR Breast Density Category b: There are scattered areas of
fibroglandular density.
FINDINGS: Cc and MLO views of left breast and spot tangential view of left
breast are submitted. At the palpable area periareolar left breast,
there is a 3 mm oil cyst. No suspicious abnormalities identified in
the left breast.

Mammographic images were processed with CAD.

Targeted ultrasound is performed, showing 3.6 mm oval hypoechoic
lesion at the left breast 11 o'clock subareolar palpable area
correlating to the oil cyst seen mammogram.
IMPRESSION: Benign findings.

RECOMMENDATION:
Routine screening mammogram back on schedule.

I have discussed the findings and recommendations with the patient.
Results were also provided in writing at the conclusion of the
visit. If applicable, a reminder letter will be sent to the patient
regarding the next appointment.

BI-RADS CATEGORY  2: Benign.

## 2020-02-01 ENCOUNTER — Other Ambulatory Visit: Payer: Self-pay | Admitting: Neurosurgery

## 2020-02-01 DIAGNOSIS — M4317 Spondylolisthesis, lumbosacral region: Secondary | ICD-10-CM

## 2020-02-21 ENCOUNTER — Other Ambulatory Visit: Payer: Self-pay

## 2020-02-21 ENCOUNTER — Ambulatory Visit
Admission: RE | Admit: 2020-02-21 | Discharge: 2020-02-21 | Disposition: A | Discharge status: Home / Self Care | Payer: 59 | Source: Ambulatory Visit | Attending: Neurosurgery | Admitting: Neurosurgery

## 2020-02-21 DIAGNOSIS — M4317 Spondylolisthesis, lumbosacral region: Secondary | ICD-10-CM

## 2020-05-30 ENCOUNTER — Other Ambulatory Visit: Payer: Self-pay | Admitting: Nurse Practitioner

## 2020-05-30 DIAGNOSIS — Z1231 Encounter for screening mammogram for malignant neoplasm of breast: Secondary | ICD-10-CM

## 2020-07-26 ENCOUNTER — Ambulatory Visit
Admission: RE | Admit: 2020-07-26 | Discharge: 2020-07-26 | Disposition: A | Discharge status: Home / Self Care | Payer: 59 | Source: Ambulatory Visit | Attending: Nurse Practitioner | Admitting: Nurse Practitioner

## 2020-07-26 ENCOUNTER — Other Ambulatory Visit: Payer: Self-pay

## 2020-07-26 ENCOUNTER — Other Ambulatory Visit: Payer: Self-pay | Admitting: Nurse Practitioner

## 2020-07-26 DIAGNOSIS — Z1231 Encounter for screening mammogram for malignant neoplasm of breast: Secondary | ICD-10-CM

## 2021-03-20 ENCOUNTER — Ambulatory Visit: Payer: Self-pay

## 2021-03-20 ENCOUNTER — Ambulatory Visit (INDEPENDENT_AMBULATORY_CARE_PROVIDER_SITE_OTHER): Payer: 59 | Admitting: Orthopaedic Surgery

## 2021-03-20 ENCOUNTER — Encounter: Payer: Self-pay | Admitting: Orthopaedic Surgery

## 2021-03-20 ENCOUNTER — Other Ambulatory Visit: Payer: Self-pay

## 2021-03-20 DIAGNOSIS — M25511 Pain in right shoulder: Secondary | ICD-10-CM

## 2021-03-20 DIAGNOSIS — G8929 Other chronic pain: Secondary | ICD-10-CM | POA: Diagnosis not present

## 2021-03-20 NOTE — Progress Notes (Signed)
Office Visit Note   Patient: Kristin Mann           Date of Birth: 03-03-1961           MRN: [id number] Visit Date: 03/20/2021              Requested by: Genia Hotter, FNP [address] [place],  [place] [address] PCP: Roseanna Rainbow, PA-C (Inactive)   Assessment & Plan: Visit Diagnoses: No diagnosis found.  Plan: Patient is a right handed 60 year old woman with a long history of chronic lower back pain with diagnosed spondylolisthesis.  She is in chronic pain management and takes oxycodone 4 times daily.  She has a 5-month history of right shoulder pain.  She denies no significant history except for running into a door jam with her shoulder prior to this.  She has pain that awakens her at night.  She was given a shot of Toradol yesterday by her primary care which did not help her pain.  She has good strength with her shoulder and motion but she is quite painful with this.  Findings consistent with impingement of her right shoulder.  We will go forward with an MRI in an open MRI scanner.  She will follow-up following this to discuss treatment options.  Follow-Up Instructions: No follow-ups on file.   Orders:  No orders of the defined types were placed in this encounter.  No orders of the defined types were placed in this encounter.     Procedures: No procedures performed   Clinical Data: No additional findings.   Subjective: No chief complaint on file. Patient presents today for right shoulder pain. She said that it has been hurting for at least 6 months. She remembers hitting the door casing around the same time, but not sure if that caused this pain or not. Her pain is located anteriorly. Decreased range of motion. She takes oxycodone daily for multiple joint aches. No previous right shoulder surgery. She is right hand dominant.   HPI  Review of Systems  All other systems reviewed and are negative.   Objective: Vital Signs: There were no vitals taken for this  visit.  Physical Exam Constitutional:      Appearance: Normal appearance.  HENT:     Head: Atraumatic.  Eyes:     Extraocular Movements: Extraocular movements intact.  Pulmonary:     Effort: Pulmonary effort is normal.     Breath sounds: Normal breath sounds.  Skin:    General: Skin is warm and dry.    Ortho Exam Examination of her right arm and shoulder she does not have any palpable deformities no ecchymosis in her upper arm or shoulder.  She has forward elevation to 170 degrees though this is painful.  She can not reach behind her back past her belt line because of pain.  She has good strength with resisted abduction with her arms at 90 degrees.  She has good strength and no pain with resisted abduction with her arms at her side.  She has some pain with speeds testing which is reproduced anteriorly.  She she also has reproduced pain with Neer's test and empty can test which reproduces impingement symptoms.  She has good grip strength in her hand intact pulses and no paresthesias or sensation changes Specialty Comments:  No specialty comments available.  Imaging: No results found.   PMFS History: Patient Active Problem List   Diagnosis Date Noted   Spondylolisthesis 10/03/2013  Hyperlipidemia 07/27/2013   Breast mass, right 07/25/2013   Anxiety state, unspecified 07/25/2013   Chronic back pain greater than 3 months duration 12/28/2011   Hypertension goal BP (blood pressure) < 130/80 12/28/2011   Diabetes mellitus with neuropathy (HCC) 12/28/2011   Peripheral neuropathy 12/28/2011   Hypothyroidism 12/28/2011   Tobacco abuse 12/28/2011   Past Medical History:  Diagnosis Date   Allergy    Breast mass    Chronic back pain    Chronic neck pain    Diabetes mellitus 2009   Gastric ulcer    NSAID induced   Gastric ulcer with hemorrhage    anaprox induced   GERD (gastroesophageal reflux disease)    Hyperlipidemia    Hypertension    Shingles    Spondylolisthesis     Thyroid disease     Family History  Problem Relation Age of Onset   Diabetes Mother    Hyperlipidemia Mother    Hypertension Mother    Cancer Father        prostate   Hyperlipidemia Father    Hypertension Father    Hyperlipidemia Sister    Hypertension Sister    Colon cancer Neg Hx    Breast cancer Neg Hx     Past Surgical History:  Procedure Laterality Date   ABDOMINAL HYSTERECTOMY  2005   partial-residual cervix   CESAREAN SECTION  1988, 1991, 1994   x3   CHOLECYSTECTOMY  1992   Social History   Occupational History   Not on file  Tobacco Use   Smoking status: Some Days    Years: 0.00    Types: Cigarettes   Smokeless tobacco: Never  Substance and Sexual Activity   Alcohol use: Yes    Alcohol/week: 5.0 standard drinks    Types: 5 Cans of beer per week   Drug use: No   Sexual activity: Not on file

## 2021-03-27 ENCOUNTER — Telehealth: Payer: Self-pay | Admitting: Orthopaedic Surgery

## 2021-03-27 NOTE — Telephone Encounter (Signed)
Called pt 1X to call and set appt with Dr. Cleophas Dunker after 04/13/21 for MRI review

## 2021-03-28 ENCOUNTER — Telehealth: Payer: Self-pay | Admitting: Orthopaedic Surgery

## 2021-03-28 NOTE — Telephone Encounter (Signed)
Called pt 2x and left vm toi set an MRI Review appt with Dr. Cleophas Dunker after 11/20

## 2021-04-13 ENCOUNTER — Other Ambulatory Visit: Payer: Self-pay

## 2021-04-13 ENCOUNTER — Ambulatory Visit
Admission: RE | Admit: 2021-04-13 | Discharge: 2021-04-13 | Disposition: A | Discharge status: Home / Self Care | Payer: 59 | Source: Ambulatory Visit | Attending: Orthopaedic Surgery | Admitting: Orthopaedic Surgery

## 2021-04-13 DIAGNOSIS — G8929 Other chronic pain: Secondary | ICD-10-CM

## 2021-04-13 DIAGNOSIS — M25511 Pain in right shoulder: Secondary | ICD-10-CM

## 2021-04-22 ENCOUNTER — Telehealth: Payer: Self-pay | Admitting: Orthopaedic Surgery

## 2021-04-22 NOTE — Telephone Encounter (Signed)
called

## 2021-04-22 NOTE — Telephone Encounter (Signed)
Pt called and is wondering what was the finding in her MRI.   CB [phone]

## 2021-04-29 ENCOUNTER — Ambulatory Visit (INDEPENDENT_AMBULATORY_CARE_PROVIDER_SITE_OTHER): Payer: 59 | Admitting: Orthopaedic Surgery

## 2021-04-29 ENCOUNTER — Other Ambulatory Visit: Payer: Self-pay

## 2021-04-29 ENCOUNTER — Encounter: Payer: Self-pay | Admitting: Orthopaedic Surgery

## 2021-04-29 DIAGNOSIS — G8929 Other chronic pain: Secondary | ICD-10-CM

## 2021-04-29 DIAGNOSIS — M25511 Pain in right shoulder: Secondary | ICD-10-CM | POA: Diagnosis not present

## 2021-04-29 NOTE — Progress Notes (Signed)
Office Visit Note   Patient: Kristin Mann           Date of Birth: 1960-09-14           MRN: [id number] Visit Date: 04/29/2021              Requested by: No referring provider defined for this encounter. PCP: Roseanna Rainbow, PA-C (Inactive)   Assessment & Plan: Visit Diagnoses: No diagnosis found.  Plan: Patient is a 60 year old woman with a 13-month history of right shoulder pain after hitting her right shoulder against a door.  Difficulty with activities such as washing her hair raising her arm above.  She did have an MRI and is here to review it today.  MRI does show full-thickness tear of the mid supraspinatus measuring 9 mm with up to 10 mm of retraction.  She did not have any other tears.  Long discussion with her regards to her treatment options.  Could go forward with a shoulder arthroscopy and a mini incision rotator cuff repair discussed the recovery outcomes and expectations.  She has a lot of concern going through with surgery as she has had many surgeries before and some complications with bleeding.  She understands without surgery there is a chance that this tear could get bigger though unlikely.  Alternatively she could try a course of physical therapy.  Also concerns for pain management after surgery considering she is on chronic pain management.She is going to think about her options and call back if she wishes to go forward with surgery or PT  Follow-Up Instructions: No follow-ups on file.   Orders:  No orders of the defined types were placed in this encounter.  No orders of the defined types were placed in this encounter.     Procedures: No procedures performed   Clinical Data: No additional findings.   Subjective: No chief complaint on file. Patient presents today for follow up on her right shoulder. She had an MRI and is here to discuss those results.    Review of Systems  All other systems reviewed and are negative.   Objective: Vital Signs:  There were no vitals taken for this visit.  Physical Exam Constitutional:      Appearance: Normal appearance.  Pulmonary:     Effort: Pulmonary effort is normal.  Neurological:     Mental Status: She is alert.  Psychiatric:        Mood and Affect: Mood normal.        Behavior: Behavior normal.    Ortho Exam Right shoulder: good strength with resisted external rotation. Is able to slowly elevate her arm over her head though this is not a smooth arc of motion and does not feel comfortable. Rotation behind her back is good Specialty Comments:  No specialty comments available.  Imaging: No results found.   PMFS History: Patient Active Problem List   Diagnosis Date Noted   Spondylolisthesis 10/03/2013   Hyperlipidemia 07/27/2013   Breast mass, right 07/25/2013   Anxiety state, unspecified 07/25/2013   Chronic back pain greater than 3 months duration 12/28/2011   Hypertension goal BP (blood pressure) < 130/80 12/28/2011   Diabetes mellitus with neuropathy (HCC) 12/28/2011   Peripheral neuropathy 12/28/2011   Hypothyroidism 12/28/2011   Tobacco abuse 12/28/2011   Past Medical History:  Diagnosis Date   Allergy    Breast mass    Chronic back pain    Chronic neck pain    Diabetes mellitus 2009  Gastric ulcer    NSAID induced   Gastric ulcer with hemorrhage    anaprox induced   GERD (gastroesophageal reflux disease)    Hyperlipidemia    Hypertension    Shingles    Spondylolisthesis    Thyroid disease     Family History  Problem Relation Age of Onset   Diabetes Mother    Hyperlipidemia Mother    Hypertension Mother    Cancer Father        prostate   Hyperlipidemia Father    Hypertension Father    Hyperlipidemia Sister    Hypertension Sister    Colon cancer Neg Hx    Breast cancer Neg Hx     Past Surgical History:  Procedure Laterality Date   ABDOMINAL HYSTERECTOMY  2005   partial-residual cervix   CESAREAN SECTION  1988, 1991, 1994   x3    CHOLECYSTECTOMY  1992   Social History   Occupational History   Not on file  Tobacco Use   Smoking status: Some Days    Years: 0.00    Types: Cigarettes   Smokeless tobacco: Never  Substance and Sexual Activity   Alcohol use: Yes    Alcohol/week: 5.0 standard drinks    Types: 5 Cans of beer per week   Drug use: No   Sexual activity: Not on file

## 2022-09-23 ENCOUNTER — Other Ambulatory Visit: Payer: Self-pay | Admitting: Family Medicine

## 2022-09-23 DIAGNOSIS — Z1231 Encounter for screening mammogram for malignant neoplasm of breast: Secondary | ICD-10-CM

## 2023-06-30 ENCOUNTER — Ambulatory Visit: Payer: Medicaid Other

## 2023-07-09 ENCOUNTER — Ambulatory Visit: Payer: Medicaid Other

## 2023-07-15 ENCOUNTER — Ambulatory Visit: Payer: Medicaid Other

## 2023-08-16 ENCOUNTER — Ambulatory Visit
Admission: RE | Admit: 2023-08-16 | Discharge: 2023-08-16 | Disposition: A | Discharge status: Home / Self Care | Source: Ambulatory Visit | Attending: Family Medicine | Admitting: Family Medicine

## 2023-08-16 DIAGNOSIS — Z1231 Encounter for screening mammogram for malignant neoplasm of breast: Secondary | ICD-10-CM
